# Patient Record
Sex: Female | Born: 1980 | Race: White | Hispanic: No | Marital: Married | Smoking: Never smoker
Health system: Southern US, Community
[De-identification: ages and names within clinical notes are randomized; demographics above are authoritative.]

## PROBLEM LIST (undated history)

## (undated) DIAGNOSIS — K8020 Calculus of gallbladder without cholecystitis without obstruction: Secondary | ICD-10-CM

## (undated) DIAGNOSIS — I10 Essential (primary) hypertension: Secondary | ICD-10-CM

## (undated) HISTORY — DX: Essential (primary) hypertension: I10

## (undated) HISTORY — PX: NO PAST SURGERIES: SHX2092

---

## 2018-09-15 LAB — OB RESULTS CONSOLE GC/CHLAMYDIA
Chlamydia: NEGATIVE
Gonorrhea: NEGATIVE

## 2018-09-15 LAB — OB RESULTS CONSOLE HEPATITIS B SURFACE ANTIGEN: Hepatitis B Surface Ag: NEGATIVE

## 2018-09-15 LAB — OB RESULTS CONSOLE RPR: RPR: NONREACTIVE

## 2018-09-15 LAB — OB RESULTS CONSOLE HIV ANTIBODY (ROUTINE TESTING): HIV: NONREACTIVE

## 2018-09-15 LAB — OB RESULTS CONSOLE ABO/RH: RH Type: NEGATIVE

## 2018-09-15 LAB — OB RESULTS CONSOLE RUBELLA ANTIBODY, IGM: Rubella: IMMUNE

## 2018-09-15 LAB — OB RESULTS CONSOLE ANTIBODY SCREEN: Antibody Screen: NEGATIVE

## 2018-11-03 ENCOUNTER — Other Ambulatory Visit: Payer: Self-pay

## 2018-11-03 DIAGNOSIS — Z20822 Contact with and (suspected) exposure to covid-19: Secondary | ICD-10-CM

## 2018-11-04 ENCOUNTER — Telehealth: Payer: Self-pay | Admitting: Obstetrics & Gynecology

## 2018-11-04 ENCOUNTER — Telehealth: Payer: Self-pay | Admitting: *Deleted

## 2018-11-04 LAB — NOVEL CORONAVIRUS, NAA: SARS-CoV-2, NAA: NOT DETECTED

## 2018-11-04 NOTE — Telephone Encounter (Signed)
Started having sinus pressure mainly on one side of her head.  Ear is now starting to hurt on right side along with eye pain.  Says her lymph nodes in her neck and face are swollen.  Says she has had sinus infections in the past and feels like she is getting one now.  Wants to know what medications she can take. Informed she can take Sudafed unless she has hypertension.  Pt states she is currently on BP medication.  Advised patient to not take medication but really sounded like she needed an antibiotic.  Informed since she was not an established patient in our office yet, we could not prescribe anything to her and could try doing an e-visit or going to Urgent Care to be evaluated.  Pt verbalized understanding.

## 2018-11-04 NOTE — Telephone Encounter (Signed)
Patient called, stated that she is 17 weeks, having some sinus trouble.  Stated she has been taking Tylenol and Flonase and still not not getting any relief.  Stated it has been going on for over a week now.  She would like to know what else she can take.  [place]

## 2018-11-13 ENCOUNTER — Other Ambulatory Visit: Payer: Self-pay | Admitting: Obstetrics and Gynecology

## 2018-11-13 DIAGNOSIS — Z363 Encounter for antenatal screening for malformations: Secondary | ICD-10-CM

## 2018-11-16 ENCOUNTER — Ambulatory Visit: Payer: BC Managed Care – PPO | Admitting: *Deleted

## 2018-11-16 ENCOUNTER — Encounter: Payer: Self-pay | Admitting: Women's Health

## 2018-11-16 ENCOUNTER — Ambulatory Visit (INDEPENDENT_AMBULATORY_CARE_PROVIDER_SITE_OTHER): Payer: BC Managed Care – PPO

## 2018-11-16 ENCOUNTER — Ambulatory Visit (INDEPENDENT_AMBULATORY_CARE_PROVIDER_SITE_OTHER): Payer: BC Managed Care – PPO | Admitting: Women's Health

## 2018-11-16 ENCOUNTER — Other Ambulatory Visit: Payer: Self-pay

## 2018-11-16 VITALS — BP 132/84 | HR 87 | Ht 70.0 in | Wt 254.0 lb

## 2018-11-16 DIAGNOSIS — O10919 Unspecified pre-existing hypertension complicating pregnancy, unspecified trimester: Secondary | ICD-10-CM

## 2018-11-16 DIAGNOSIS — O0990 Supervision of high risk pregnancy, unspecified, unspecified trimester: Secondary | ICD-10-CM

## 2018-11-16 DIAGNOSIS — Z363 Encounter for antenatal screening for malformations: Secondary | ICD-10-CM

## 2018-11-16 DIAGNOSIS — O0992 Supervision of high risk pregnancy, unspecified, second trimester: Secondary | ICD-10-CM

## 2018-11-16 DIAGNOSIS — O10912 Unspecified pre-existing hypertension complicating pregnancy, second trimester: Secondary | ICD-10-CM | POA: Diagnosis not present

## 2018-11-16 DIAGNOSIS — Z3A18 18 weeks gestation of pregnancy: Secondary | ICD-10-CM

## 2018-11-16 DIAGNOSIS — O4692 Antepartum hemorrhage, unspecified, second trimester: Secondary | ICD-10-CM

## 2018-11-16 DIAGNOSIS — Z1389 Encounter for screening for other disorder: Secondary | ICD-10-CM

## 2018-11-16 DIAGNOSIS — Z331 Pregnant state, incidental: Secondary | ICD-10-CM

## 2018-11-16 DIAGNOSIS — O4690 Antepartum hemorrhage, unspecified, unspecified trimester: Secondary | ICD-10-CM

## 2018-11-16 DIAGNOSIS — Z6791 Unspecified blood type, Rh negative: Secondary | ICD-10-CM

## 2018-11-16 DIAGNOSIS — O26899 Other specified pregnancy related conditions, unspecified trimester: Secondary | ICD-10-CM | POA: Insufficient documentation

## 2018-11-16 LAB — POCT URINALYSIS DIPSTICK OB
Blood, UA: NEGATIVE
Glucose, UA: NEGATIVE
Ketones, UA: NEGATIVE
Leukocytes, UA: NEGATIVE
Nitrite, UA: NEGATIVE
POC,PROTEIN,UA: NEGATIVE

## 2018-11-16 NOTE — Progress Notes (Signed)
INITIAL OBSTETRICAL VISIT Patient name: Tammy Mcmahon MRN [id number]  Date of birth: 10/12/1980 Chief Complaint:   Initial Prenatal Visit  History of Present Illness:   Tammy Mcmahon is a 38 y.o. [phone] Caucasian female at [redacted]w[redacted]d by LMP c/w 7wk u/s, with an Estimated Date of Delivery: 04/13/19 being seen today for her initial obstetrical visit with [place], she is transferring from [place].  Random occ spotting throughout pregnancy. Hasn't had rhogam.  Her obstetrical history is significant for term uncomplicated SVB x 2- CHTN 2nd pregnancy.  CHTN on Labetalol 100mg  BID, ASA daily Today she reports no complaints.  Patient's last menstrual period was 07/07/2018. Last pap 09/15/18. Results were: neg Review of Systems:   Pertinent items are noted in HPI Denies cramping/contractions, leakage of fluid, vaginal bleeding, abnormal vaginal discharge w/ itching/odor/irritation, headaches, visual changes, shortness of breath, chest pain, abdominal pain, severe nausea/vomiting, or problems with urination or bowel movements unless otherwise stated above.  Pertinent History Reviewed:  Reviewed past medical,surgical, social, obstetrical and family history.  Reviewed problem list, medications and allergies. OB History  Gravida Para Term Preterm AB Living  4 2 2   1 2   SAB TAB Ectopic Multiple Live Births  1       2    # Outcome Date GA Lbr Len/2nd Weight Sex Delivery Anes PTL Lv  4 Current           3 SAB 05/2017 [redacted]w[redacted]d         2 Term 09/06/11 [redacted]w[redacted]d  5 lb 14 oz (2.665 kg) M Vag-Spont None N LIV  1 Term 11/12/06 [redacted]w[redacted]d  5 lb 13 oz (2.637 kg) M Vag-Vacuum EPI N LIV   Physical Assessment:   Vitals:   11/16/18 1503 11/16/18 1503  BP: 132/84   Pulse: 87   Weight: 254 lb (115.2 kg)   Height:  5\' 10"  (1.778 m)  Body mass index is 36.45 kg/m.       Physical Examination:  General appearance - well appearing, and in no distress  Mental status - alert, oriented to person, place, and time  Psych:  She has a  normal mood and affect  Skin - warm and dry, normal color, no suspicious lesions noted  Chest - effort normal, all lung fields clear to auscultation bilaterally  Heart - normal rate and regular rhythm  Abdomen - soft, nontender  Extremities:  No swelling or varicosities noted  Thin prep pap is not done   TODAY'S anatomy u/s: [place] 44+3 wks,cephalic,posterior placenta gr 0,cx 4.4 cm,svp of fluid 4.4 cm,normal ovaries bilat,fhr 150 bpm,EFW 256 g 38%,anatomy complete,no obvious abnormalities   Results for orders placed or performed in visit on 11/16/18 (from the past 24 hour(s))  POC Urinalysis Dipstick OB   Collection Time: 11/16/18  3:14 PM  Result Value Ref Range   Color, UA     Clarity, UA     Glucose, UA Negative Negative   Bilirubin, UA     Ketones, UA neg    Spec Grav, UA     Blood, UA neg    pH, UA     POC,PROTEIN,UA Negative Negative, Trace, Small (1+), Moderate (2+), Large (3+), 4+   Urobilinogen, UA     Nitrite, UA neg    Leukocytes, UA Negative Negative   Appearance     Odor      Assessment & Plan:  1) High-Risk Pregnancy [id number] at [redacted]w[redacted]d with an Estimated Date of Delivery: 04/13/19   2) Initial OB  visit  3) CHTN> on Labetalol 100mg  BID, ASA. Growth u/s @ 24, 28, 32, 36wks    2x/wk testing nst/sono @ 32wks or weekly BPP    Deliver 39wks (37wks if poor control)  4) Random occasional spotting> none in 1-2wks, rh neg, rhogam today  Meds: No orders of the defined types were placed in this encounter.   Initial labs obtained Continue prenatal vitamins Reviewed n/v relief measures and warning s/s to report Reviewed recommended weight gain based on pre-gravid BMI Encouraged well-balanced diet Genetic Screening discussed: declined Cystic fibrosis, SMA, Fragile X screening discussed declined Ultrasound discussed; fetal survey: results reviewed [facility]NC completed>PCM not here, form faxed Check bp weekly, let us know if >140/90.   Follow-up: Return in about 4 weeks (around  12/14/2018) for HROB, US:EFW, in person.   Orders Placed This Encounter  Procedures  . US OB Follow Up  . RHO (D) Immune Globulin  . OB RESULTS CONSOLE GC/Chlamydia  . OB RESULTS CONSOLE RPR  . OB RESULTS CONSOLE HIV antibody  . OB RESULTS CONSOLE Rubella Antibody  . OB RESULTS CONSOLE Hepatitis B surface antigen  . POC Urinalysis Dipstick OB  . OB RESULTS CONSOLE ABO/Rh  . OB RESULTS CONSOLE Antibody Screen    Cheral MarkerKimberly R Manha Amato CNM, [facility]NP[facility]BC 11/16/2018 4:44 PM

## 2018-11-16 NOTE — Progress Notes (Signed)
[place] 50+5 wks,cephalic,posterior placenta gr 0,svp of fluid 4.4 cm,normal ovaries bilat,fhr 150 bpm,EFW 256 g 38%,anatomy complete,no obvious abnormalities

## 2018-11-16 NOTE — Patient Instructions (Addendum)
Tammy Mcmahon, I greatly value your feedback.  If you receive a survey following your visit with us today, we appreciate you taking the time to fill it out.  Thanks, Joellyn HaffKim Armeda Plumb, CNM, [facility]NP[facility]  [facility] is now [facility] ([address] [place], [place] [id number]) Entrance located off of [address] Free 24/7 valet parking   Nausea & Vomiting  Have saltine crackers or pretzels by your bed and eat a few bites before you raise your head out of bed in the morning  Eat small frequent meals throughout the day instead of large meals  Drink plenty of fluids throughout the day to stay hydrated, just don't drink a lot of fluids with your meals.  This can make your stomach fill up faster making you feel sick  Do not brush your teeth right after you eat  Products with real ginger are good for nausea, like ginger ale and ginger hard candy Make sure it says made with real ginger!  Sucking on sour candy like lemon heads is also good for nausea  If your prenatal vitamins make you nauseated, take them at night so you will sleep through the nausea  Sea Bands  If you feel like you need medicine for the nausea & vomiting please let us know  If you are unable to keep any fluids or food down please let us know   Constipation  Drink plenty of fluid, preferably water, throughout the day  Eat foods high in fiber such as fruits, vegetables, and grains  Exercise, such as walking, is a good way to keep your bowels regular  Drink warm fluids, especially warm prune juice, or decaf coffee  Eat a 1/2 cup of real oatmeal (not instant), 1/2 cup applesauce, and 1/2-1 cup warm prune juice every day  If needed, you may take Colace (docusate sodium) stool softener once or twice a day to help keep the stool soft.   If you still are having problems with constipation, you may take Miralax once daily as needed to help keep your bowels regular.   Home Blood  Pressure Monitoring for Patients   Your provider has recommended that you check your blood pressure (BP) at least once a week at home. If you do not have a blood pressure cuff at home, one will be provided for you. Contact your provider if you have not received your monitor within 1 week.   Helpful Tips for Accurate Home Blood Pressure Checks  . Don't smoke, exercise, or drink caffeine 30 minutes before checking your BP . Use the restroom before checking your BP (a full bladder can raise your pressure) . Relax in a comfortable upright chair . Feet on the ground . Left arm resting comfortably on a flat surface at the level of your heart . Legs uncrossed . Back supported . Sit quietly and don't talk . Place the cuff on your bare arm . Adjust snuggly, so that only two fingertips can fit between your skin and the top of the cuff . Check 2 readings separated by at least one minute . Keep a log of your BP readings . For a visual, please reference this diagram: http://ccnc.care/bpdiagram  Provider Name: [facility]     Phone: [phone]  Zone 1: ALL CLEAR  Continue to monitor your symptoms:  . BP reading is less than 140 (top number) or less than 90 (bottom number)  . No right upper stomach pain .  No headaches or seeing spots . No feeling nauseated or throwing up . No swelling in face and hands  Zone 2: CAUTION Call your doctor's office for any of the following:  . BP reading is greater than 140 (top number) or greater than 90 (bottom number)  . Stomach pain under your ribs in the middle or right side . Headaches or seeing spots . Feeling nauseated or throwing up . Swelling in face and hands  Zone 3: EMERGENCY  Seek immediate medical care if you have any of the following:  . BP reading is greater than160 (top number) or greater than 110 (bottom number) . Severe headaches not improving with Tylenol . Serious difficulty catching your breath . Any worsening symptoms from Zone  2    Second Trimester of Pregnancy The second trimester is from week 14 through week 27 (months 4 through 6). The second trimester is often a time when you feel your best. Your body has adjusted to being pregnant, and you begin to feel better physically. Usually, morning sickness has lessened or quit completely, you may have more energy, and you may have an increase in appetite. The second trimester is also a time when the fetus is growing rapidly. At the end of the sixth month, the fetus is about 9 inches long and weighs about 1 pounds. You will likely begin to feel the baby move (quickening) between 16 and 20 weeks of pregnancy. Body changes during your second trimester Your body continues to go through many changes during your second trimester. The changes vary from woman to woman.  Your weight will continue to increase. You will notice your lower abdomen bulging out.  You may begin to get stretch marks on your hips, abdomen, and breasts.  You may develop headaches that can be relieved by medicines. The medicines should be approved by your health care provider.  You may urinate more often because the fetus is pressing on your bladder.  You may develop or continue to have heartburn as a result of your pregnancy.  You may develop constipation because certain hormones are causing the muscles that push waste through your intestines to slow down.  You may develop hemorrhoids or swollen, bulging veins (varicose veins).  You may have back pain. This is caused by: ? Weight gain. ? Pregnancy hormones that are relaxing the joints in your pelvis. ? A shift in weight and the muscles that support your balance.  Your breasts will continue to grow and they will continue to become tender.  Your gums may bleed and may be sensitive to brushing and flossing.  Dark spots or blotches (chloasma, mask of pregnancy) may develop on your face. This will likely fade after the baby is born.  A dark line  from your belly button to the pubic area (linea nigra) may appear. This will likely fade after the baby is born.  You may have changes in your hair. These can include thickening of your hair, rapid growth, and changes in texture. Some women also have hair loss during or after pregnancy, or hair that feels dry or thin. Your hair will most likely return to normal after your baby is born. What to expect at prenatal visits During a routine prenatal visit:  You will be weighed to make sure you and the fetus are growing normally.  Your blood pressure will be taken.  Your abdomen will be measured to track your baby's growth.  The fetal heartbeat will be listened to.  Any  test results from the previous visit will be discussed. Your health care provider may ask you:  How you are feeling.  If you are feeling the baby move.  If you have had any abnormal symptoms, such as leaking fluid, bleeding, severe headaches, or abdominal cramping.  If you are using any tobacco products, including cigarettes, chewing tobacco, and electronic cigarettes.  If you have any questions. Other tests that may be performed during your second trimester include:  Blood tests that check for: ? Low iron levels (anemia). ? High blood sugar that affects pregnant women (gestational diabetes) between 47 and 28 weeks. ? Rh antibodies. This is to check for a protein on red blood cells (Rh factor).  Urine tests to check for infections, diabetes, or protein in the urine.  An ultrasound to confirm the proper growth and development of the baby.  An amniocentesis to check for possible genetic problems.  Fetal screens for spina bifida and Down syndrome.  HIV (human immunodeficiency virus) testing. Routine prenatal testing includes screening for HIV, unless you choose not to have this test. Follow these instructions at home: Medicines  Follow your health care provider's instructions regarding medicine use. Specific  medicines may be either safe or unsafe to take during pregnancy.  Take a prenatal vitamin that contains at least 600 micrograms (mcg) of folic acid.  If you develop constipation, try taking a stool softener if your health care provider approves. Eating and drinking   Eat a balanced diet that includes fresh fruits and vegetables, whole grains, good sources of protein such as meat, eggs, or tofu, and low-fat dairy. Your health care provider will help you determine the amount of weight gain that is right for you.  Avoid raw meat and uncooked cheese. These carry germs that can cause birth defects in the baby.  If you have low calcium intake from food, talk to your health care provider about whether you should take a daily calcium supplement.  Limit foods that are high in fat and processed sugars, such as fried and sweet foods.  To prevent constipation: ? Drink enough fluid to keep your urine clear or pale yellow. ? Eat foods that are high in fiber, such as fresh fruits and vegetables, whole grains, and beans. Activity  Exercise only as directed by your health care provider. Most women can continue their usual exercise routine during pregnancy. Try to exercise for 30 minutes at least 5 days a week. Stop exercising if you experience uterine contractions.  Avoid heavy lifting, wear low heel shoes, and practice good posture.  A sexual relationship may be continued unless your health care provider directs you otherwise. Relieving pain and discomfort  Wear a good support bra to prevent discomfort from breast tenderness.  Take warm sitz baths to soothe any pain or discomfort caused by hemorrhoids. Use hemorrhoid cream if your health care provider approves.  Rest with your legs elevated if you have leg cramps or low back pain.  If you develop varicose veins, wear support hose. Elevate your feet for 15 minutes, 3-4 times a day. Limit salt in your diet. Prenatal Care  Write down your  questions. Take them to your prenatal visits.  Keep all your prenatal visits as told by your health care provider. This is important. Safety  Wear your seat belt at all times when driving.  Make a list of emergency phone numbers, including numbers for family, friends, the hospital, and police and fire departments. General instructions  Ask your health care  provider for a referral to a local prenatal education class. Begin classes no later than the beginning of month 6 of your pregnancy.  Ask for help if you have counseling or nutritional needs during pregnancy. Your health care provider can offer advice or refer you to specialists for help with various needs.  Do not use hot tubs, steam rooms, or saunas.  Do not douche or use tampons or scented sanitary pads.  Do not cross your legs for long periods of time.  Avoid cat litter boxes and soil used by cats. These carry germs that can cause birth defects in the baby and possibly loss of the fetus by miscarriage or stillbirth.  Avoid all smoking, herbs, alcohol, and unprescribed drugs. Chemicals in these products can affect the formation and growth of the baby.  Do not use any products that contain nicotine or tobacco, such as cigarettes and e-cigarettes. If you need help quitting, ask your health care provider.  Visit your dentist if you have not gone yet during your pregnancy. Use a soft toothbrush to brush your teeth and be gentle when you floss. Contact a health care provider if:  You have dizziness.  You have mild pelvic cramps, pelvic pressure, or nagging pain in the abdominal area.  You have persistent nausea, vomiting, or diarrhea.  You have a bad smelling vaginal discharge.  You have pain when you urinate. Get help right away if:  You have a fever.  You are leaking fluid from your vagina.  You have spotting or bleeding from your vagina.  You have severe abdominal cramping or pain.  You have rapid weight gain or  weight loss.  You have shortness of breath with chest pain.  You notice sudden or extreme swelling of your face, hands, ankles, feet, or legs.  You have not felt your baby move in over an hour.  You have severe headaches that do not go away when you take medicine.  You have vision changes. Summary  The second trimester is from week 14 through week 27 (months 4 through 6). It is also a time when the fetus is growing rapidly.  Your body goes through many changes during pregnancy. The changes vary from woman to woman.  Avoid all smoking, herbs, alcohol, and unprescribed drugs. These chemicals affect the formation and growth your baby.  Do not use any tobacco products, such as cigarettes, chewing tobacco, and e-cigarettes. If you need help quitting, ask your health care provider.  Contact your health care provider if you have any questions. Keep all prenatal visits as told by your health care provider. This is important. This information is not intended to replace advice given to you by your health care provider. Make sure you discuss any questions you have with your health care provider. Document Released: 03/05/2001 Document Revised: 07/03/2018 Document Reviewed: 04/16/2016 Elsevier Patient Education  2020 [employer] Inc.  Coronavirus (COVID-19) Are you at risk?  Are you at risk for the Coronavirus (COVID-19)?  To be considered HIGH RISK for Coronavirus (COVID-19), you have to meet the following criteria:  . Traveled to [place], [place], [place], [place] or [place]; or in the [place] to [place], [place], or [place]; and have fever, cough, and shortness of breath within the last 2 weeks of travel OR . Been in close contact with a person diagnosed with COVID-19 within the last 2 weeks and have fever, cough, and shortness of breath . IF YOU DO NOT MEET THESE CRITERIA, YOU  ARE CONSIDERED LOW RISK FOR COVID-19.  What to do if you are HIGH RISK for COVID-19?  Marland Kitchen If  you are having a medical emergency, call 911. . Seek medical care right away. Before you go to a doctor's office, urgent care or emergency department, call ahead and tell them about your recent travel, contact with someone diagnosed with COVID-19, and your symptoms. You should receive instructions from your physician's office regarding next steps of care.  . When you arrive at healthcare provider, tell the healthcare staff immediately you have returned from visiting [place], [place], [place], [place] or [place]; or traveled in the [place] to [place], [place], [place], or [place]; in the last two weeks or you have been in close contact with a person diagnosed with COVID-19 in the last 2 weeks.   . Tell the health care staff about your symptoms: fever, cough and shortness of breath. . After you have been seen by a medical provider, you will be either: o Tested for (COVID-19) and discharged home on quarantine except to seek medical care if symptoms worsen, and asked to  - Stay home and avoid contact with others until you get your results (4-5 days)  - Avoid travel on public transportation if possible (such as bus, train, or airplane) or o Sent to the Emergency Department by EMS for evaluation, COVID-19 testing, and possible admission depending on your condition and test results.  What to do if you are LOW RISK for COVID-19?  Reduce your risk of any infection by using the same precautions used for avoiding the common cold or flu:  Marland Kitchen Wash your hands often with soap and warm water for at least 20 seconds.  If soap and water are not readily available, use an alcohol-based hand sanitizer with at least 60% alcohol.  . If coughing or sneezing, cover your mouth and nose by coughing or sneezing into the elbow areas of your shirt or coat, into a tissue or into your sleeve (not your hands). . Avoid shaking hands with others and consider head nods or verbal greetings only. . Avoid touching your eyes,  nose, or mouth with unwashed hands.  . Avoid close contact with people who are sick. . Avoid places or events with large numbers of people in one location, like concerts or sporting events. . Carefully consider travel plans you have or are making. . If you are planning any travel outside or inside the [place], visit the CDC's Travelers' Health webpage for the latest health notices. . If you have some symptoms but not all symptoms, continue to monitor at home and seek medical attention if your symptoms worsen. . If you are having a medical emergency, call 911.   [place] / e-Visit: [email or web address]         [facility]: [facility]: [phone]                   [facility]: [phone]

## 2018-12-12 ENCOUNTER — Encounter (HOSPITAL_COMMUNITY): Payer: Self-pay

## 2018-12-12 ENCOUNTER — Inpatient Hospital Stay (HOSPITAL_COMMUNITY)
Admission: AD | Admit: 2018-12-12 | Discharge: 2018-12-13 | Disposition: A | Discharge status: Home / Self Care | Payer: BC Managed Care – PPO | Attending: Obstetrics and Gynecology | Admitting: Obstetrics and Gynecology

## 2018-12-12 ENCOUNTER — Other Ambulatory Visit: Payer: Self-pay

## 2018-12-12 ENCOUNTER — Inpatient Hospital Stay (HOSPITAL_COMMUNITY): Payer: BC Managed Care – PPO

## 2018-12-12 DIAGNOSIS — Z3A22 22 weeks gestation of pregnancy: Secondary | ICD-10-CM | POA: Insufficient documentation

## 2018-12-12 DIAGNOSIS — R1010 Upper abdominal pain, unspecified: Secondary | ICD-10-CM | POA: Diagnosis not present

## 2018-12-12 DIAGNOSIS — O26612 Liver and biliary tract disorders in pregnancy, second trimester: Secondary | ICD-10-CM | POA: Insufficient documentation

## 2018-12-12 DIAGNOSIS — O162 Unspecified maternal hypertension, second trimester: Secondary | ICD-10-CM | POA: Insufficient documentation

## 2018-12-12 DIAGNOSIS — O26892 Other specified pregnancy related conditions, second trimester: Secondary | ICD-10-CM | POA: Diagnosis not present

## 2018-12-12 DIAGNOSIS — K8000 Calculus of gallbladder with acute cholecystitis without obstruction: Secondary | ICD-10-CM | POA: Insufficient documentation

## 2018-12-12 DIAGNOSIS — Z79899 Other long term (current) drug therapy: Secondary | ICD-10-CM | POA: Diagnosis not present

## 2018-12-12 LAB — COMPREHENSIVE METABOLIC PANEL
ALT: 14 U/L (ref 0–44)
AST: 59 U/L — ABNORMAL HIGH (ref 15–41)
Albumin: 3 g/dL — ABNORMAL LOW (ref 3.5–5.0)
Alkaline Phosphatase: 137 U/L — ABNORMAL HIGH (ref 38–126)
Anion gap: 9 (ref 5–15)
BUN: <5 mg/dL — ABNORMAL LOW (ref 6–20)
CO2: 22 mmol/L (ref 22–32)
Calcium: 8.6 mg/dL — ABNORMAL LOW (ref 8.9–10.3)
Chloride: 104 mmol/L (ref 98–111)
Creatinine, Ser: 0.52 mg/dL (ref 0.44–1.00)
GFR calc Af Amer: >60 mL/min (ref >60)
GFR calc non Af Amer: >60 mL/min (ref >60)
Glucose, Bld: 98 mg/dL (ref 70–99)
Potassium: 3.5 mmol/L (ref 3.5–5.1)
Sodium: 135 mmol/L (ref 135–145)
Total Bilirubin: 1.6 mg/dL — ABNORMAL HIGH (ref 0.3–1.2)
Total Protein: 6.3 g/dL — ABNORMAL LOW (ref 6.5–8.1)

## 2018-12-12 LAB — URINALYSIS, ROUTINE W REFLEX MICROSCOPIC
Bilirubin Urine: NEGATIVE
Glucose, UA: NEGATIVE mg/dL
Hgb urine dipstick: NEGATIVE
Ketones, ur: 5 mg/dL — AB
Leukocytes,Ua: NEGATIVE
Nitrite: NEGATIVE
Protein, ur: NEGATIVE mg/dL
Specific Gravity, Urine: 1.008 (ref 1.005–1.030)
pH: 7 (ref 5.0–8.0)

## 2018-12-12 LAB — PROTEIN / CREATININE RATIO, URINE
Creatinine, Urine: 51.31 mg/dL
Total Protein, Urine: <6 mg/dL

## 2018-12-12 LAB — CBC
HCT: 32.4 % — ABNORMAL LOW (ref 36.0–46.0)
Hemoglobin: 10.8 g/dL — ABNORMAL LOW (ref 12.0–15.0)
MCH: 29 pg (ref 26.0–34.0)
MCHC: 33.3 g/dL (ref 30.0–36.0)
MCV: 87.1 fL (ref 80.0–100.0)
Platelets: 186 10*3/uL (ref 150–400)
RBC: 3.72 MIL/uL — ABNORMAL LOW (ref 3.87–5.11)
RDW: 12.7 % (ref 11.5–15.5)
WBC: 9.3 10*3/uL (ref 4.0–10.5)
nRBC: 0 % (ref 0.0–0.2)

## 2018-12-12 LAB — LIPASE, BLOOD: Lipase: 30 U/L (ref 11–51)

## 2018-12-12 MED ORDER — ALUM & MAG HYDROXIDE-SIMETH 200-200-20 MG/5ML PO SUSP
30.0000 mL | Freq: Once | ORAL | Status: AC
  Administered 2018-12-12: 30 mL via ORAL
  Filled 2018-12-12: qty 30

## 2018-12-12 MED ORDER — LIDOCAINE VISCOUS HCL 2 % MT SOLN
15.0000 mL | Freq: Once | OROMUCOSAL | Status: AC
  Administered 2018-12-12: 23:00:00 15 mL via ORAL
  Filled 2018-12-12: qty 15

## 2018-12-12 MED ORDER — LABETALOL HCL 100 MG PO TABS
100.0000 mg | ORAL_TABLET | Freq: Once | ORAL | Status: AC
  Administered 2018-12-12: 100 mg via ORAL
  Filled 2018-12-12: qty 1

## 2018-12-12 MED ORDER — SOD CITRATE-CITRIC ACID 500-334 MG/5ML PO SOLN
30.0000 mL | Freq: Once | ORAL | Status: AC
  Administered 2018-12-12: 30 mL via ORAL
  Filled 2018-12-12: qty 30

## 2018-12-12 NOTE — MAU Provider Note (Signed)
History     CSN: [id number]  Arrival date and time: 12/12/18 2147   First Provider Initiated Contact with Patient 12/12/18 2224      Chief Complaint  Patient presents with  . Abdominal Pain   G4P2012 @22 .4 wks presenting with upper abd pain. Pain started this am. Located bilateral, but worse on right, and epigastric. Radiates to right shoulder. Feels like gas pain. Has tried Gas-X. Admits to eating gravy biscuit this am which she rarely eats. Rates 6/10. Reports +FM. No pregnancy c/o.    OB History    Gravida  4   Para  2   Term  2   Preterm      AB  1   Living  2     SAB  1   TAB      Ectopic      Multiple      Live Births  2           Past Medical History:  Diagnosis Date  . Hypertension     Past Surgical History:  Procedure Laterality Date  . NO PAST SURGERIES      Family History  Problem Relation Age of Onset  . Diabetes Father     Social History   Tobacco Use  . Smoking status: Never Smoker  . Smokeless tobacco: Never Used  Substance Use Topics  . Alcohol use: Never    Frequency: Never  . Drug use: Never    Allergies:  Allergies  Allergen Reactions  . Fish Oil     Medications Prior to Admission  Medication Sig Dispense Refill Last Dose  . GNP ASPIRIN LOW DOSE 81 MG EC tablet    12/12/2018 at Unknown time  . labetalol (NORMODYNE) 100 MG tablet 100 mg 2 (two) times daily.    12/12/2018 at Unknown time  . ondansetron (ZOFRAN) 8 MG tablet Take by mouth every 8 (eight) hours as needed for nausea or vomiting.   12/11/2018 at Unknown time  . ipratropium (ATROVENT) 0.06 % nasal spray      . montelukast (SINGULAIR) 10 MG tablet        Review of Systems  Constitutional: Negative for fever.  Respiratory: Negative for shortness of breath.   Cardiovascular: Negative for chest pain.  Gastrointestinal: Positive for abdominal pain and nausea. Negative for constipation, diarrhea and vomiting.  Genitourinary: Negative for vaginal bleeding.    Physical Exam   Blood pressure 110/65, pulse 88, temperature 97.8 F (36.6 C), temperature source Oral, resp. rate 18, height 5\' 10"  (1.778 m), weight 117.2 kg, last menstrual period 07/07/2018, SpO2 99 %. Patient Vitals for the past 24 hrs:  BP Temp Temp src Pulse Resp SpO2 Height Weight  12/13/18 0116 110/65 - - 88 - - - -  12/13/18 0046 (!) 145/81 - - 93 - - - -  12/13/18 0037 (!) 157/100 - - 98 - - - -  12/12/18 2331 118/65 - - 96 - - - -  12/12/18 2316 113/67 - - 94 - - - -  12/12/18 2301 124/67 - - 98 - - - -  12/12/18 2218 (!) 147/89 - - (!) 118 - - - -  12/12/18 2207 (!) 154/86 97.8 F (36.6 C) Oral (!) 116 18 99 % - -  12/12/18 2159 - - - - - - 5\' 10"  (1.778 m) 117.2 kg    Physical Exam  Nursing note and vitals reviewed. Constitutional: She is oriented to person, place, and time. She appears  well-developed and well-nourished. No distress.  HENT:  Head: Normocephalic and atraumatic.  Neck: Normal range of motion.  Respiratory: Effort normal.  GI: Soft. She exhibits no distension and no mass. There is abdominal tenderness in the right upper quadrant and epigastric area. There is no rebound and no guarding.  gravid  Musculoskeletal: Normal range of motion.  Neurological: She is alert and oriented to person, place, and time.  Skin: Skin is warm and dry.  Psychiatric: She has a normal mood and affect.  FHT 158  Results for orders placed or performed during the hospital encounter of 12/12/18 (from the past 24 hour(s))  Urinalysis, Routine w reflex microscopic     Status: Abnormal   Collection Time: 12/12/18 10:16 PM  Result Value Ref Range   Color, Urine YELLOW YELLOW   APPearance CLEAR CLEAR   Specific Gravity, Urine 1.008 1.005 - 1.030   pH 7.0 5.0 - 8.0   Glucose, UA NEGATIVE NEGATIVE mg/dL   Hgb urine dipstick NEGATIVE NEGATIVE   Bilirubin Urine NEGATIVE NEGATIVE   Ketones, ur 5 (A) NEGATIVE mg/dL   Protein, ur NEGATIVE NEGATIVE mg/dL   Nitrite NEGATIVE  NEGATIVE   Leukocytes,Ua NEGATIVE NEGATIVE  Protein / creatinine ratio, urine     Status: None   Collection Time: 12/12/18 10:16 PM  Result Value Ref Range   Creatinine, Urine 51.31 mg/dL   Total Protein, Urine <6 mg/dL   Protein Creatinine Ratio        0.00 - 0.15 mg/mg[Cre]  CBC     Status: Abnormal   Collection Time: 12/12/18 10:34 PM  Result Value Ref Range   WBC 9.3 4.0 - 10.5 K/uL   RBC 3.72 (L) 3.87 - 5.11 MIL/uL   Hemoglobin 10.8 (L) 12.0 - 15.0 g/dL   HCT 16.132.4 (L) 09.636.0 - 04.546.0 %   MCV 87.1 80.0 - 100.0 fL   MCH 29.0 26.0 - 34.0 pg   MCHC 33.3 30.0 - 36.0 g/dL   RDW 40.912.7 81.111.5 - 91.415.5 %   Platelets 186 150 - 400 K/uL   nRBC 0.0 0.0 - 0.2 %  Comprehensive metabolic panel     Status: Abnormal   Collection Time: 12/12/18 10:34 PM  Result Value Ref Range   Sodium 135 135 - 145 mmol/L   Potassium 3.5 3.5 - 5.1 mmol/L   Chloride 104 98 - 111 mmol/L   CO2 22 22 - 32 mmol/L   Glucose, Bld 98 70 - 99 mg/dL   BUN <5 (L) 6 - 20 mg/dL   Creatinine, Ser 7.820.52 0.44 - 1.00 mg/dL   Calcium 8.6 (L) 8.9 - 10.3 mg/dL   Total Protein 6.3 (L) 6.5 - 8.1 g/dL   Albumin 3.0 (L) 3.5 - 5.0 g/dL   AST 59 (H) 15 - 41 U/L   ALT 14 0 - 44 U/L   Alkaline Phosphatase 137 (H) 38 - 126 U/L   Total Bilirubin 1.6 (H) 0.3 - 1.2 mg/dL   GFR calc non Af Amer >60 >60 mL/min   GFR calc Af Amer >60 >60 mL/min   Anion gap 9 5 - 15  Lipase, blood     Status: None   Collection Time: 12/12/18 10:34 PM  Result Value Ref Range   Lipase 30 11 - 51 U/L   [place] Abdomen Limited Ruq  Result Date: 12/13/2018 CLINICAL DATA:  Right upper quadrant pain EXAM: ULTRASOUND ABDOMEN LIMITED RIGHT UPPER QUADRANT COMPARISON:  None. FINDINGS: Gallbladder: There is a mobile stone within the gallbladder  that measures up to 1.8 cm. A positive sonographic Percell Miller sign was reported by the sonographer. No gallbladder wall thickening or pericholecystic fluid. Common bile duct: Diameter: 12 mm Liver: No focal lesion identified. Within normal  limits in parenchymal echogenicity. Portal vein is patent on color Doppler imaging with normal direction of blood flow towards the liver. Other: None. IMPRESSION: Cholelithiasis with reported positive sonographic Percell Miller sign is compatible with acute cholecystitis in the appropriate context. Dilated common bile duct. Electronically Signed   By: Ulyses Jarred M.D.   On: 12/13/2018 00:24   MAU Course  Procedures GI cocktail Bicitra Morphine Phenergan  MDM Labs ordered and reviewed. Temporary relief after GI cocktail. Bicitra ordered. Elevated AST, RUQ [place] ordered. Cholelithiasis found on [place], no leukocytosis or fever. Consult with Dr. Rip Harbour, will treat pain/nausea and place ambulatory referral to general surgery. Pain improved after pain meds. Stable for discharge home.   Assessment and Plan   1. Calculus of gallbladder with acute cholecystitis without obstruction   2. Upper abdominal pain   3. [redacted] weeks gestation of pregnancy    Discharge home Follow up at [facility] in 2 days Referral to General Surgery placed [facility] until 9/22- letter provided Rx Phenergan Rx Percocet- advised against driving Return precautions  Allergies as of 12/13/2018      Reactions   Fish Oil       Medication List    TAKE these medications   GNP Aspirin Low Dose 81 MG EC tablet Generic drug: aspirin   ipratropium 0.06 % nasal spray Commonly known as: ATROVENT   labetalol 100 MG tablet Commonly known as: NORMODYNE 100 mg 2 (two) times daily.   montelukast 10 MG tablet Commonly known as: SINGULAIR   ondansetron 8 MG tablet Commonly known as: ZOFRAN Take by mouth every 8 (eight) hours as needed for nausea or vomiting.   oxyCODONE-acetaminophen 5-325 MG tablet Commonly known as: PERCOCET/ROXICET Take 1 tablet by mouth every 4 (four) hours as needed for severe pain.   promethazine 25 MG tablet Commonly known as: PHENERGAN Take 0.5-1 tablets (12.5-25 mg total) by mouth every 6 (six) hours as needed  for nausea or vomiting.      Julianne Handler, CNM 12/13/2018, 1:43 AM

## 2018-12-12 NOTE — MAU Note (Signed)
Pt here with c/o upper abdominal pain. Started this morning that it feels like gas pain. Took Mylanta and gas X. Helps some, but pain never completely goes away. Radiating across upper abdomen. Is passing gas. Tried Tylenol but did not help. Feels tender to touch and bloated. Denies vaginal bleeding or discharge. Feels like she has heartburn-burning in chest and throat. Does not taken anything for heartburn.

## 2018-12-13 DIAGNOSIS — O26612 Liver and biliary tract disorders in pregnancy, second trimester: Secondary | ICD-10-CM | POA: Diagnosis not present

## 2018-12-13 DIAGNOSIS — K8020 Calculus of gallbladder without cholecystitis without obstruction: Secondary | ICD-10-CM | POA: Insufficient documentation

## 2018-12-13 MED ORDER — MORPHINE SULFATE (PF) 4 MG/ML IV SOLN
4.0000 mg | Freq: Once | INTRAVENOUS | Status: AC
  Administered 2018-12-13: 4 mg via INTRAMUSCULAR
  Filled 2018-12-13: qty 1

## 2018-12-13 MED ORDER — PROMETHAZINE HCL 25 MG PO TABS: 12.5000 mg | ORAL_TABLET | Freq: Four times a day (QID) | ORAL | 0 refills | Status: DC | PRN

## 2018-12-13 MED ORDER — PROMETHAZINE HCL 25 MG/ML IJ SOLN
25.0000 mg | Freq: Four times a day (QID) | INTRAMUSCULAR | Status: DC | PRN
  Administered 2018-12-13: 25 mg via INTRAMUSCULAR
  Filled 2018-12-13: qty 1

## 2018-12-13 MED ORDER — OXYCODONE-ACETAMINOPHEN 5-325 MG PO TABS: 1.0000 | ORAL_TABLET | ORAL | 0 refills | Status: DC | PRN

## 2018-12-13 NOTE — Discharge Instructions (Signed)
Cholelithiasis  Cholelithiasis is also called "gallstones." It is a kind of gallbladder disease. The gallbladder is an organ that stores a liquid (bile) that helps you digest fat. Gallstones may not cause symptoms (may be silent gallstones) until they cause a blockage, and then they can cause pain (gallbladder attack). Follow these instructions at home:  Take over-the-counter and prescription medicines only as told by your doctor.  Stay at a healthy weight.  Eat healthy foods. This includes: ? Eating fewer fatty foods, like fried foods. ? Eating fewer refined carbs (refined carbohydrates). Refined carbs are breads and grains that are highly processed, like white bread and white rice. Instead, choose whole grains like whole-wheat bread and brown rice. ? Eating more fiber. Almonds, fresh fruit, and beans are healthy sources of fiber.  Keep all follow-up visits as told by your doctor. This is important. Contact a doctor if:  You have sudden pain in the upper right side of your belly (abdomen). Pain might spread to your right shoulder or your chest. This may be a sign of a gallbladder attack.  You feel sick to your stomach (are nauseous).  You throw up (vomit).  You have been diagnosed with gallstones that have no symptoms and you get: ? Belly pain. ? Discomfort, burning, or fullness in the upper part of your belly (indigestion). Get help right away if:  You have sudden pain in the upper right side of your belly, and it lasts for more than 2 hours.  You have belly pain that lasts for more than 5 hours.  You have a fever or chills.  You keep feeling sick to your stomach or you keep throwing up.  Your skin or the whites of your eyes turn yellow (jaundice).  You have dark-colored pee (urine).  You have light-colored poop (stool). Summary  Cholelithiasis is also called "gallstones."  The gallbladder is an organ that stores a liquid (bile) that helps you digest fat.  Silent  gallstones are gallstones that do not cause symptoms.  A gallbladder attack may cause sudden pain in the upper right side of your belly. Pain might spread to your right shoulder or your chest. If this happens, contact your doctor.  If you have sudden pain in the upper right side of your belly that lasts for more than 2 hours, get help right away. This information is not intended to replace advice given to you by your health care provider. Make sure you discuss any questions you have with your health care provider. Document Released: 08/28/2007 Document Revised: 02/21/2017 Document Reviewed: 11/26/2015 Elsevier Patient Education  2020 [employer]  Gallbladder Eating Plan If you have a gallbladder condition, you may have trouble digesting fats. Eating a low-fat diet can help reduce your symptoms, and may be helpful before and after having surgery to remove your gallbladder (cholecystectomy). Your health care provider may recommend that you work with a diet and nutrition specialist (dietitian) to help you reduce the amount of fat in your diet. What are tips for following this plan? General guidelines  Limit your fat intake to less than 30% of your total daily calories. If you eat around 1,800 calories each day, this is less than 60 grams (g) of fat per day.  Fat is an important part of a healthy diet. Eating a low-fat diet can make it hard to maintain a healthy body weight. Ask your dietitian how much fat, calories, and other nutrients you need each day.  Eat small, frequent meals throughout the  day instead of three large meals.  Drink at least 8-10 cups of fluid a day. Drink enough fluid to keep your urine clear or pale yellow.  Limit alcohol intake to no more than 1 drink a day for nonpregnant women and 2 drinks a day for men. One drink equals 12 oz of beer, 5 oz of wine, or 1 oz of hard liquor. Reading food labels  Check Nutrition Facts on food labels for the amount of fat per serving.  Choose foods with less than 3 grams of fat per serving. Shopping  Choose nonfat and low-fat healthy foods. Look for the words nonfat, low fat, or fat free.  Avoid buying processed or prepackaged foods. Cooking  Cook using low-fat methods, such as baking, broiling, grilling, or boiling.  Cook with small amounts of healthy fats, such as olive oil, grapeseed oil, canola oil, or sunflower oil. What foods are recommended?   All fresh, frozen, or canned fruits and vegetables.  Whole grains.  Low-fat or non-fat (skim) milk and yogurt.  Lean meat, skinless poultry, fish, eggs, and beans.  Low-fat protein supplement powders or drinks.  Spices and herbs. What foods are not recommended?  High-fat foods. These include baked goods, fast food, fatty cuts of meat, ice cream, french toast, sweet rolls, pizza, cheese bread, foods covered with butter, creamy sauces, or cheese.  Fried foods. These include french fries, tempura, battered fish, breaded chicken, fried breads, and sweets.  Foods with strong odors.  Foods that cause bloating and gas. Summary  A low-fat diet can be helpful if you have a gallbladder condition, or before and after gallbladder surgery.  Limit your fat intake to less than 30% of your total daily calories. This is about 60 g of fat if you eat 1,800 calories each day.  Eat small, frequent meals throughout the day instead of three large meals. This information is not intended to replace advice given to you by your health care provider. Make sure you discuss any questions you have with your health care provider. Document Released: 03/16/2013 Document Revised: 07/02/2018 Document Reviewed: 04/18/2016 Elsevier Patient Education  2020 [employer].

## 2018-12-14 ENCOUNTER — Other Ambulatory Visit: Payer: Self-pay

## 2018-12-14 ENCOUNTER — Ambulatory Visit (INDEPENDENT_AMBULATORY_CARE_PROVIDER_SITE_OTHER): Payer: BC Managed Care – PPO | Admitting: Advanced Practice Midwife

## 2018-12-14 ENCOUNTER — Ambulatory Visit (INDEPENDENT_AMBULATORY_CARE_PROVIDER_SITE_OTHER): Payer: BC Managed Care – PPO

## 2018-12-14 ENCOUNTER — Encounter: Payer: Self-pay | Admitting: Advanced Practice Midwife

## 2018-12-14 VITALS — BP 135/82 | HR 101 | Wt 256.0 lb

## 2018-12-14 DIAGNOSIS — O10912 Unspecified pre-existing hypertension complicating pregnancy, second trimester: Secondary | ICD-10-CM | POA: Diagnosis not present

## 2018-12-14 DIAGNOSIS — Z3A22 22 weeks gestation of pregnancy: Secondary | ICD-10-CM

## 2018-12-14 DIAGNOSIS — K8000 Calculus of gallbladder with acute cholecystitis without obstruction: Secondary | ICD-10-CM

## 2018-12-14 DIAGNOSIS — O0992 Supervision of high risk pregnancy, unspecified, second trimester: Secondary | ICD-10-CM

## 2018-12-14 DIAGNOSIS — Z331 Pregnant state, incidental: Secondary | ICD-10-CM

## 2018-12-14 DIAGNOSIS — Z1389 Encounter for screening for other disorder: Secondary | ICD-10-CM

## 2018-12-14 DIAGNOSIS — O0990 Supervision of high risk pregnancy, unspecified, unspecified trimester: Secondary | ICD-10-CM

## 2018-12-14 DIAGNOSIS — O10919 Unspecified pre-existing hypertension complicating pregnancy, unspecified trimester: Secondary | ICD-10-CM

## 2018-12-14 LAB — POCT URINALYSIS DIPSTICK OB
Blood, UA: NEGATIVE
Glucose, UA: NEGATIVE
Ketones, UA: NEGATIVE
Leukocytes, UA: NEGATIVE
Nitrite, UA: NEGATIVE
POC,PROTEIN,UA: NEGATIVE

## 2018-12-14 MED ORDER — OMEPRAZOLE 20 MG PO CPDR: 20.0000 mg | DELAYED_RELEASE_CAPSULE | Freq: Every day | ORAL | 6 refills | Status: DC

## 2018-12-14 NOTE — Progress Notes (Signed)
Korea 67+5 wks,cephalic,posterior placenta gr 0,normal ovaries bilat,svp of fluid 6 cm,fhr 157 bpm,cx 3.8 cm,EFW 553 g 49 %

## 2018-12-14 NOTE — Progress Notes (Signed)
HIGH-RISK PREGNANCY VISIT Patient name: Tammy Mcmahon MRN [id number]  Date of birth: [birth year] Chief Complaint:   High Risk Gestation ([place] today; went to [facility] Saturday with gallstones; + pain)  History of Present Illness:   Catherene Kaleta is a 38 y.o. [id number] female at [redacted]w[redacted]d with an Estimated Date of Delivery: 04/13/19 being seen today for ongoing management of a high-risk pregnancy complicated by [facility] currently on labetalol Dx w/gallstones yesterday in MAU, referred to General Surgery sent per Dr. Marjory Lies note.  Today she reports RUQ pain if sits up too long.  Hasn't taken any percocet, doesn't want to unless has to.  [place] is bad. Rx prilosec. Marland Kitchen Contractions: Not present. Vag. Bleeding: None.  Movement: Present. denies leaking of fluid.  Review of Systems:   Pertinent items are noted in HPI Denies abnormal vaginal discharge w/ itching/odor/irritation, headaches, visual changes, shortness of breath, chest pain, abdominal pain, severe nausea/vomiting, or problems with urination or bowel movements unless otherwise stated above. Pertinent History Reviewed:  Reviewed past medical,surgical, social, obstetrical and family history.  Reviewed problem list, medications and allergies. Physical Assessment:   Vitals:   12/14/18 1630 12/14/18 1634  BP: (!) 144/86 135/82  Pulse: (!) 101 (!) 101  Weight: 256 lb (116.1 kg)   Body mass index is 36.73 kg/m.           Physical Examination:   General appearance: alert, well appearing, and in no distress  Mental status: alert, oriented to person, place, and time  Skin: warm & dry   Extremities: Edema: Trace    Cardiovascular: normal heart rate noted  Respiratory: normal respiratory effort, no distress  Abdomen: gravid, soft,   Pelvic: Cervical exam deferred         Fetal Status:     Movement: Present    Fetal Surveillance Testing today: [place].  [place] 17+5 wks,cephalic,posterior placenta gr 0,normal ovaries bilat,svp of fluid 6 cm,fhr 157  bpm,cx 3.8 cm,EFW 553 g 49 %   Results for orders placed or performed in visit on 12/14/18 (from the past 24 hour(s))  POC Urinalysis Dipstick OB   Collection Time: 12/14/18  4:07 PM  Result Value Ref Range   Color, UA     Clarity, UA     Glucose, UA Negative Negative   Bilirubin, UA     Ketones, UA neg    Spec Grav, UA     Blood, UA neg    pH, UA     POC,PROTEIN,UA Negative Negative, Trace, Small (1+), Moderate (2+), Large (3+), 4+   Urobilinogen, UA     Nitrite, UA neg    Leukocytes, UA Negative Negative   Appearance     Odor      Assessment & Plan:  1) High-risk pregnancy [id number] at [redacted]w[redacted]d with an Estimated Date of Delivery: 04/13/19   2) CHTN, stable  3) Cholelithiasis, continue current meds.  If doesn't hear from [place] by Thursday, let me know  Meds:  Meds ordered this encounter  Medications  . omeprazole (PRILOSEC) 20 MG capsule    Sig: Take 1 capsule (20 mg total) by mouth daily.    Dispense:  30 capsule    Refill:  6    Order Specific Question:   Supervising Provider    Answer:   Tania Ade H [[id number]]    Labs/procedures today: none  Treatment Plan: growth Korea 28, 33, 36wks     2x/wk testing nst/sono @ 36wks or weekly BPP   Deliver @ 39wks:______  Reviewed: Preterm labor symptoms and general obstetric precautions including but not limited to vaginal bleeding, contractions, leaking of fluid and fetal movement were reviewed in detail with the patient.  All questions were answered. Has home bp cuff.  Check bp weekly, let us know ifconsistently >140/90.   Follow-up: Return in about 4 weeks (around 01/11/2019) for HROB, PN2.  Orders Placed This Encounter  Procedures  . POC Urinalysis Dipstick OB   Jacklyn Shell DNP, CNM 12/14/2018 5:06 PM

## 2018-12-14 NOTE — Patient Instructions (Signed)

## 2018-12-17 ENCOUNTER — Encounter: Payer: Self-pay | Admitting: Advanced Practice Midwife

## 2018-12-18 ENCOUNTER — Other Ambulatory Visit: Payer: Self-pay | Admitting: *Deleted

## 2018-12-18 ENCOUNTER — Telehealth: Payer: Self-pay | Admitting: *Deleted

## 2018-12-18 NOTE — Telephone Encounter (Signed)
Patient left two messages on nurse line. States that she needs zofran called in. Also said that Manus Gunning told her to let us know if she hasn't heard from general surgeon for gallbladder.

## 2018-12-21 ENCOUNTER — Other Ambulatory Visit: Payer: Self-pay | Admitting: *Deleted

## 2018-12-21 MED ORDER — ONDANSETRON HCL 8 MG PO TABS: 8.0000 mg | ORAL_TABLET | Freq: Three times a day (TID) | ORAL | 1 refills | Status: DC | PRN

## 2018-12-23 ENCOUNTER — Encounter: Payer: Self-pay | Admitting: *Deleted

## 2018-12-25 ENCOUNTER — Telehealth: Payer: Self-pay | Admitting: *Deleted

## 2018-12-25 ENCOUNTER — Encounter: Payer: Self-pay | Admitting: *Deleted

## 2018-12-25 NOTE — Telephone Encounter (Signed)
Patent states she is feeling better but is still having issues with her stomach at times.  She has made some changes to her diet and thinks this is helping along with taking the Prilosec.  Encouraged patient to continue low fat diet and information sent via mychart for reference.  Advised to let us know if symptoms worsen, and we can send referral to [facility].  Pt verbalized understanding and agreeable to plan.

## 2019-01-05 ENCOUNTER — Other Ambulatory Visit: Payer: Self-pay

## 2019-01-05 DIAGNOSIS — Z20822 Contact with and (suspected) exposure to covid-19: Secondary | ICD-10-CM

## 2019-01-07 LAB — NOVEL CORONAVIRUS, NAA: SARS-CoV-2, NAA: NOT DETECTED

## 2019-01-08 ENCOUNTER — Encounter: Payer: Self-pay | Admitting: Women's Health

## 2019-01-08 ENCOUNTER — Ambulatory Visit (INDEPENDENT_AMBULATORY_CARE_PROVIDER_SITE_OTHER): Payer: BC Managed Care – PPO | Admitting: Women's Health

## 2019-01-08 ENCOUNTER — Encounter: Payer: BC Managed Care – PPO | Admitting: Women's Health

## 2019-01-08 ENCOUNTER — Other Ambulatory Visit: Payer: BC Managed Care – PPO

## 2019-01-08 ENCOUNTER — Other Ambulatory Visit: Payer: Self-pay

## 2019-01-08 VITALS — BP 123/76 | HR 95 | Wt 261.8 lb

## 2019-01-08 DIAGNOSIS — O0990 Supervision of high risk pregnancy, unspecified, unspecified trimester: Secondary | ICD-10-CM

## 2019-01-08 DIAGNOSIS — Z3A26 26 weeks gestation of pregnancy: Secondary | ICD-10-CM

## 2019-01-08 DIAGNOSIS — Z331 Pregnant state, incidental: Secondary | ICD-10-CM

## 2019-01-08 DIAGNOSIS — Z1389 Encounter for screening for other disorder: Secondary | ICD-10-CM

## 2019-01-08 DIAGNOSIS — O10912 Unspecified pre-existing hypertension complicating pregnancy, second trimester: Secondary | ICD-10-CM

## 2019-01-08 DIAGNOSIS — Z131 Encounter for screening for diabetes mellitus: Secondary | ICD-10-CM

## 2019-01-08 DIAGNOSIS — O10919 Unspecified pre-existing hypertension complicating pregnancy, unspecified trimester: Secondary | ICD-10-CM

## 2019-01-08 DIAGNOSIS — O0992 Supervision of high risk pregnancy, unspecified, second trimester: Secondary | ICD-10-CM

## 2019-01-08 LAB — POCT URINALYSIS DIPSTICK OB
Blood, UA: NEGATIVE
Glucose, UA: NEGATIVE
Ketones, UA: NEGATIVE
Leukocytes, UA: NEGATIVE
Nitrite, UA: NEGATIVE
POC,PROTEIN,UA: NEGATIVE

## 2019-01-08 NOTE — Progress Notes (Signed)
   HIGH-RISK PREGNANCY VISIT Patient name: Tammy Mcmahon MRN [id number]  Date of birth: Aug 20, 1980 Chief Complaint:   High Risk Gestation (PN2)  History of Present Illness:   Jimi Giza is a 38 y.o. [phone] female at [redacted]w[redacted]d with an Estimated Date of Delivery: 04/13/19 being seen today for ongoing management of a high-risk pregnancy complicated by chronic hypertension currently on Labetalol 100mg  BID. Cholelithiasis- stable.  Today she reports no complaints. Contractions: Not present.  .  Movement: Present. denies leaking of fluid.  Review of Systems:   Pertinent items are noted in HPI Denies abnormal vaginal discharge w/ itching/odor/irritation, headaches, visual changes, shortness of breath, chest pain, abdominal pain, severe nausea/vomiting, or problems with urination or bowel movements unless otherwise stated above. Pertinent History Reviewed:  Reviewed past medical,surgical, social, obstetrical and family history.  Reviewed problem list, medications and allergies. Physical Assessment:   Vitals:   01/08/19 0918  BP: 123/76  Pulse: 95  Weight: 261 lb 12.8 oz (118.8 kg)  Body mass index is 37.56 kg/m.           Physical Examination:   General appearance: alert, well appearing, and in no distress  Mental status: alert, oriented to person, place, and time  Skin: warm & dry   Extremities: Edema: None    Cardiovascular: normal heart rate noted  Respiratory: normal respiratory effort, no distress  Abdomen: gravid, soft, non-tender  Pelvic: Cervical exam deferred         Fetal Status: Fetal Heart Rate (bpm): 154   Movement: Present    Fetal Surveillance Testing today: doppler   Results for orders placed or performed in visit on 01/08/19 (from the past 24 hour(s))  POC Urinalysis Dipstick OB   Collection Time: 01/08/19  9:21 AM  Result Value Ref Range   Color, UA     Clarity, UA     Glucose, UA Negative Negative   Bilirubin, UA     Ketones, UA neg    Spec Grav, UA     Blood,  UA neg    pH, UA     POC,PROTEIN,UA Negative Negative, Trace, Small (1+), Moderate (2+), Large (3+), 4+   Urobilinogen, UA     Nitrite, UA neg    Leukocytes, UA Negative Negative   Appearance     Odor      Assessment & Plan:  1) High-risk pregnancy [id number] at [redacted]w[redacted]d with an Estimated Date of Delivery: 04/13/19   2) CHTN, stable on Labetalol 100mg  BID, ASA  3) Cholelithiasis, stable, on low-fat diet  Meds: No orders of the defined types were placed in this encounter.  Labs/procedures today: pn2, declines flu shot today- will get at pharmacy  Treatment Plan:  Growth u/s- needs now then q4wks     2x/wk testing nst/sono @ 32wks or weekly BPP    Deliver 38-39wks (37wks or prn if poor control)  Reviewed: Preterm labor symptoms and general obstetric precautions including but not limited to vaginal bleeding, contractions, leaking of fluid and fetal movement were reviewed in detail with the patient.  All questions were answered.   Follow-up: Return for asap efw u/s, then 4wks for HROB , US:EFW, in person, MD.  Orders Placed This Encounter  Procedures  . US OB Follow Up  . POC Urinalysis Dipstick OB   Roma Schanz CNM, [facility] 01/08/2019 10:32 AM

## 2019-01-08 NOTE — Patient Instructions (Signed)
Tammy Mcmahon, I greatly value your feedback.  If you receive a survey following your visit with [place] today, we appreciate you taking the time to fill it out.  Thanks, Tammy Mcmahon, CNM, [facility]  [place]!!! It is now [place] at [address] [facility] ([place], [place] [address]) Entrance located off of [address] parking    Go to [email or web address] to register for FREE online childbirth classes   Call the office [phone]) or go to [facility] if:  You begin to have strong, frequent contractions  Your water breaks.  Sometimes it is a big gush of fluid, sometimes it is just a trickle that keeps getting your panties wet or running down your legs  You have vaginal bleeding.  It is normal to have a small amount of spotting if your cervix was checked.   You don't feel your baby moving like normal.  If you don't, get you something to eat and drink and lay down and focus on feeling your baby move.  You should feel at least 10 movements in 2 hours.  If you don't, you should call the office or go to [facility].    Tdap Vaccine  It is recommended that you get the Tdap vaccine during the third trimester of EACH pregnancy to help protect your baby from getting pertussis (whooping cough)  27-36 weeks is the BEST time to do this so that you can pass the protection on to your baby. During pregnancy is better than after pregnancy, but if you are unable to get it during pregnancy it will be offered at the hospital.   You can get this vaccine with [place], at the health department, your family doctor, or some local pharmacies  Everyone who will be around your baby should also be up-to-date on their vaccines before the baby comes. Adults (who are not pregnant) only need 1 dose of Tdap during adulthood.   [place] Pediatricians/Family Doctors:  [facility] [phone]                  [place] [phone] (usually not accepting new patients unless you have family there already, you are always welcome to call and ask)       [facility] [phone]       [facility] Pediatricians/Family Doctors:   [facility]: [phone]  [facility]: [phone]  [facility]: [place] Doctors:   [facility]: [facility]: [place]:  [place]: [phone]   Home Blood Pressure Monitoring for Patients   Your provider has recommended that you check your blood pressure (BP) at least once a week at home. If you do not have a blood pressure cuff at home, one will be provided for you. Contact your provider if you have not received your monitor within 1 week.   Helpful Tips for Accurate Home Blood Pressure Checks  . Don't smoke, exercise, or drink caffeine 30 minutes before checking your BP . Use the restroom before checking your BP (a full bladder can raise your pressure) . Relax in a comfortable upright chair . Feet on the ground . Left arm resting comfortably on a flat surface at the level of your heart . Legs uncrossed . Back supported . Sit quietly and don't talk .  Place the cuff on your bare arm . Adjust snuggly, so that only two fingertips can fit between your skin and the top of the cuff . Check 2 readings separated by at least one minute . Keep a log of your BP readings . For a visual, please reference this diagram: http://ccnc.care/bpdiagram  Provider Name: [facility]     Phone: [phone]  Zone 1: ALL CLEAR  Continue to monitor your symptoms:  . BP reading is less than 140 (top number) or less than 90 (bottom number)  . No right upper stomach pain . No headaches or seeing spots . No feeling nauseated or throwing up . No swelling in face and  hands  Zone 2: CAUTION Call your doctor's office for any of the following:  . BP reading is greater than 140 (top number) or greater than 90 (bottom number)  . Stomach pain under your ribs in the middle or right side . Headaches or seeing spots . Feeling nauseated or throwing up . Swelling in face and hands  Zone 3: EMERGENCY  Seek immediate medical care if you have any of the following:  . BP reading is greater than160 (top number) or greater than 110 (bottom number) . Severe headaches not improving with Tylenol . Serious difficulty catching your breath . Any worsening symptoms from Zone 2   Third Trimester of Pregnancy The third trimester is from week 29 through week 42, months 7 through 9. The third trimester is a time when the fetus is growing rapidly. At the end of the ninth month, the fetus is about 20 inches in length and weighs 6-10 pounds.  BODY CHANGES Your body goes through many changes during pregnancy. The changes vary from woman to woman.   Your weight will continue to increase. You can expect to gain 25-35 pounds (11-16 kg) by the end of the pregnancy.  You may begin to get stretch marks on your hips, abdomen, and breasts.  You may urinate more often because the fetus is moving lower into your pelvis and pressing on your bladder.  You may develop or continue to have heartburn as a result of your pregnancy.  You may develop constipation because certain hormones are causing the muscles that push waste through your intestines to slow down.  You may develop hemorrhoids or swollen, bulging veins (varicose veins).  You may have pelvic pain because of the weight gain and pregnancy hormones relaxing your joints between the bones in your pelvis. Backaches may result from overexertion of the muscles supporting your posture.  You may have changes in your hair. These can include thickening of your hair, rapid growth, and changes in texture. Some women also have hair loss during  or after pregnancy, or hair that feels dry or thin. Your hair will most likely return to normal after your baby is born.  Your breasts will continue to grow and be tender. A yellow discharge may leak from your breasts called colostrum.  Your belly button may stick out.  You may feel short of breath because of your expanding uterus.  You may notice the fetus "dropping," or moving lower in your abdomen.  You may have a bloody mucus discharge. This usually occurs a few days to a week before labor begins.  Your cervix becomes thin and soft (effaced) near your due date. WHAT TO EXPECT AT YOUR PRENATAL EXAMS  You will have prenatal exams every 2 weeks until week 36. Then, you will have weekly prenatal exams. During  a routine prenatal visit:  You will be weighed to make sure you and the fetus are growing normally.  Your blood pressure is taken.  Your abdomen will be measured to track your baby's growth.  The fetal heartbeat will be listened to.  Any test results from the previous visit will be discussed.  You may have a cervical check near your due date to see if you have effaced. At around 36 weeks, your caregiver will check your cervix. At the same time, your caregiver will also perform a test on the secretions of the vaginal tissue. This test is to determine if a type of bacteria, Group B streptococcus, is present. Your caregiver will explain this further. Your caregiver may ask you:  What your birth plan is.  How you are feeling.  If you are feeling the baby move.  If you have had any abnormal symptoms, such as leaking fluid, bleeding, severe headaches, or abdominal cramping.  If you have any questions. Other tests or screenings that may be performed during your third trimester include:  Blood tests that check for low iron levels (anemia).  Fetal testing to check the health, activity level, and growth of the fetus. Testing is done if you have certain medical conditions or if  there are problems during the pregnancy. FALSE LABOR You may feel small, irregular contractions that eventually go away. These are called Braxton Hicks contractions, or false labor. Contractions may last for hours, days, or even weeks before true labor sets in. If contractions come at regular intervals, intensify, or become painful, it is best to be seen by your caregiver.  SIGNS OF LABOR   Menstrual-like cramps.  Contractions that are 5 minutes apart or less.  Contractions that start on the top of the uterus and spread down to the lower abdomen and back.  A sense of increased pelvic pressure or back pain.  A watery or bloody mucus discharge that comes from the vagina. If you have any of these signs before the 37th week of pregnancy, call your caregiver right away. You need to go to the hospital to get checked immediately. HOME CARE INSTRUCTIONS   Avoid all smoking, herbs, alcohol, and unprescribed drugs. These chemicals affect the formation and growth of the baby.  Follow your caregiver's instructions regarding medicine use. There are medicines that are either safe or unsafe to take during pregnancy.  Exercise only as directed by your caregiver. Experiencing uterine cramps is a good sign to stop exercising.  Continue to eat regular, healthy meals.  Wear a good support bra for breast tenderness.  Do not use hot tubs, steam rooms, or saunas.  Wear your seat belt at all times when driving.  Avoid raw meat, uncooked cheese, cat litter boxes, and soil used by cats. These carry germs that can cause birth defects in the baby.  Take your prenatal vitamins.  Try taking a stool softener (if your caregiver approves) if you develop constipation. Eat more high-fiber foods, such as fresh vegetables or fruit and whole grains. Drink plenty of fluids to keep your urine clear or pale yellow.  Take warm sitz baths to soothe any pain or discomfort caused by hemorrhoids. Use hemorrhoid cream if your  caregiver approves.  If you develop varicose veins, wear support hose. Elevate your feet for 15 minutes, 3-4 times a day. Limit salt in your diet.  Avoid heavy lifting, wear low heal shoes, and practice good posture.  Rest a lot with your legs elevated if you  have leg cramps or low back pain.  Visit your dentist if you have not gone during your pregnancy. Use a soft toothbrush to brush your teeth and be gentle when you floss.  A sexual relationship may be continued unless your caregiver directs you otherwise.  Do not travel far distances unless it is absolutely necessary and only with the approval of your caregiver.  Take prenatal classes to understand, practice, and ask questions about the labor and delivery.  Make a trial run to the hospital.  Pack your hospital bag.  Prepare the baby's nursery.  Continue to go to all your prenatal visits as directed by your caregiver. SEEK MEDICAL CARE IF:  You are unsure if you are in labor or if your water has broken.  You have dizziness.  You have mild pelvic cramps, pelvic pressure, or nagging pain in your abdominal area.  You have persistent nausea, vomiting, or diarrhea.  You have a bad smelling vaginal discharge.  You have pain with urination. SEEK IMMEDIATE MEDICAL CARE IF:   You have a fever.  You are leaking fluid from your vagina.  You have spotting or bleeding from your vagina.  You have severe abdominal cramping or pain.  You have rapid weight loss or gain.  You have shortness of breath with chest pain.  You notice sudden or extreme swelling of your face, hands, ankles, feet, or legs.  You have not felt your baby move in over an hour.  You have severe headaches that do not go away with medicine.  You have vision changes. Document Released: 03/05/2001 Document Revised: 03/16/2013 Document Reviewed: 05/12/2012 [facility] Patient Information 2015 [place], [place]. This information is not intended to replace advice  given to you by your health care provider. Make sure you discuss any questions you have with your health care provider.

## 2019-01-11 ENCOUNTER — Other Ambulatory Visit: Payer: BC Managed Care – PPO

## 2019-01-11 ENCOUNTER — Other Ambulatory Visit: Payer: Self-pay | Admitting: Women's Health

## 2019-01-11 ENCOUNTER — Encounter: Payer: BC Managed Care – PPO | Admitting: Women's Health

## 2019-01-11 MED ORDER — FERROUS SULFATE 325 (65 FE) MG PO TABS: 325.0000 mg | ORAL_TABLET | Freq: Two times a day (BID) | ORAL | 3 refills | Status: DC

## 2019-01-12 LAB — HIV ANTIBODY (ROUTINE TESTING W REFLEX): HIV Screen 4th Generation wRfx: NONREACTIVE

## 2019-01-12 LAB — CBC
Hematocrit: 32.3 % — ABNORMAL LOW (ref 34.0–46.6)
Hemoglobin: 10.2 g/dL — ABNORMAL LOW (ref 11.1–15.9)
MCH: 27.7 pg (ref 26.6–33.0)
MCHC: 31.6 g/dL (ref 31.5–35.7)
MCV: 88 fL (ref 79–97)
Platelets: 199 10*3/uL (ref 150–450)
RBC: 3.68 x10E6/uL — ABNORMAL LOW (ref 3.77–5.28)
RDW: 13.1 % (ref 11.7–15.4)
WBC: 6.9 10*3/uL (ref 3.4–10.8)

## 2019-01-12 LAB — AB SCR+ANTIBODY ID: Antibody Screen: POSITIVE — AB

## 2019-01-12 LAB — ANTIBODY SCREEN

## 2019-01-12 LAB — GLUCOSE TOLERANCE, 2 HOURS W/ 1HR
Glucose, 1 hour: 128 mg/dL (ref 65–179)
Glucose, 2 hour: 95 mg/dL (ref 65–152)
Glucose, Fasting: 80 mg/dL (ref 65–91)

## 2019-01-12 LAB — RPR: RPR Ser Ql: NONREACTIVE

## 2019-01-13 ENCOUNTER — Telehealth: Payer: Self-pay | Admitting: *Deleted

## 2019-01-13 NOTE — Telephone Encounter (Signed)
Patient states that she is having uti symptoms. Wanted to see if she can come here to do a urine sample or if it was ok to go to her PCP.

## 2019-01-14 ENCOUNTER — Other Ambulatory Visit: Payer: Self-pay

## 2019-02-04 ENCOUNTER — Ambulatory Visit (INDEPENDENT_AMBULATORY_CARE_PROVIDER_SITE_OTHER): Payer: BC Managed Care – PPO

## 2019-02-04 ENCOUNTER — Other Ambulatory Visit: Payer: Self-pay

## 2019-02-04 ENCOUNTER — Ambulatory Visit (INDEPENDENT_AMBULATORY_CARE_PROVIDER_SITE_OTHER): Payer: BC Managed Care – PPO | Admitting: Obstetrics & Gynecology

## 2019-02-04 ENCOUNTER — Encounter: Payer: Self-pay | Admitting: Obstetrics & Gynecology

## 2019-02-04 VITALS — BP 138/82 | HR 94 | Wt 270.0 lb

## 2019-02-04 DIAGNOSIS — O10919 Unspecified pre-existing hypertension complicating pregnancy, unspecified trimester: Secondary | ICD-10-CM

## 2019-02-04 DIAGNOSIS — Z3A30 30 weeks gestation of pregnancy: Secondary | ICD-10-CM

## 2019-02-04 DIAGNOSIS — O0993 Supervision of high risk pregnancy, unspecified, third trimester: Secondary | ICD-10-CM

## 2019-02-04 DIAGNOSIS — Z362 Encounter for other antenatal screening follow-up: Secondary | ICD-10-CM

## 2019-02-04 DIAGNOSIS — Z1389 Encounter for screening for other disorder: Secondary | ICD-10-CM

## 2019-02-04 DIAGNOSIS — Z331 Pregnant state, incidental: Secondary | ICD-10-CM

## 2019-02-04 DIAGNOSIS — O10913 Unspecified pre-existing hypertension complicating pregnancy, third trimester: Secondary | ICD-10-CM

## 2019-02-04 DIAGNOSIS — O0992 Supervision of high risk pregnancy, unspecified, second trimester: Secondary | ICD-10-CM

## 2019-02-04 DIAGNOSIS — O0990 Supervision of high risk pregnancy, unspecified, unspecified trimester: Secondary | ICD-10-CM

## 2019-02-04 LAB — POCT URINALYSIS DIPSTICK OB
Blood, UA: NEGATIVE
Glucose, UA: NEGATIVE
Ketones, UA: NEGATIVE
Leukocytes, UA: NEGATIVE
Nitrite, UA: NEGATIVE

## 2019-02-04 NOTE — Progress Notes (Signed)
[place] 12+8 wks,cephalic,posterior placenta gr 1,fhr 157 bpm,afi 12 cm,left renal pelvic dilatation 8 mm,normal right kidney,efw 1683 g 63%

## 2019-02-04 NOTE — Progress Notes (Signed)
   HIGH-RISK PREGNANCY VISIT Patient name: Tammy Mcmahon MRN [id number]  Date of birth: 1980-12-11 Chief Complaint:   High Risk Gestation (U/S)  History of Present Illness:   Tammy Mcmahon is a 38 y.o. [phone] female at [redacted]w[redacted]d with an Estimated Date of Delivery: 04/13/19 being seen today for ongoing management of a high-risk pregnancy complicated by chronic hypertension currently on labetalol 100 mg BID.  Today she reports no complaints. Contractions: Not present.  .  Movement: Present. denies leaking of fluid.  Review of Systems:   Pertinent items are noted in HPI Denies abnormal vaginal discharge w/ itching/odor/irritation, headaches, visual changes, shortness of breath, chest pain, abdominal pain, severe nausea/vomiting, or problems with urination or bowel movements unless otherwise stated above. Pertinent History Reviewed:  Reviewed past medical,surgical, social, obstetrical and family history.  Reviewed problem list, medications and allergies. Physical Assessment:   Vitals:   02/04/19 1604  BP: 138/82  Pulse: 94  Weight: 270 lb (122.5 kg)  Body mass index is 38.74 kg/m.           Physical Examination:   General appearance: alert, well appearing, and in no distress  Mental status: alert, oriented to person, place, and time  Skin: warm & dry   Extremities: Edema: Trace    Cardiovascular: normal heart rate noted  Respiratory: normal respiratory effort, no distress  Abdomen: gravid, soft, non-tender  Pelvic: Cervical exam deferred         Fetal Status:     Movement: Present    Fetal Surveillance Testing today: BPP 8/8   Chaperone: n/a    Results for orders placed or performed in visit on 02/04/19 (from the past 24 hour(s))  POC Urinalysis Dipstick OB   Collection Time: 02/04/19  4:08 PM  Result Value Ref Range   Color, UA     Clarity, UA     Glucose, UA Negative Negative   Bilirubin, UA     Ketones, UA neg    Spec Grav, UA     Blood, UA neg    pH, UA     POC,PROTEIN,UA Trace Negative, Trace, Small (1+), Moderate (2+), Large (3+), 4+   Urobilinogen, UA     Nitrite, UA neg    Leukocytes, UA Negative Negative   Appearance     Odor      Assessment & Plan:  1) High-risk pregnancy [id number] at [redacted]w[redacted]d with an Estimated Date of Delivery: 04/13/19   2) CHTN, stable, labetalol 100 mg BID  3) Mild RPD, stable, minimal 8 mm  Meds: No orders of the defined types were placed in this encounter.   Labs/procedures today: sonogram  Treatment Plan:  Begin twice weekly surveillance at 32 weeks  Reviewed: Preterm labor symptoms and general obstetric precautions including but not limited to vaginal bleeding, contractions, leaking of fluid and fetal movement were reviewed in detail with the patient.  All questions were answered. Has home bp cuff. Rx faxed to . Check bp weekly, let us know if >140/90.   Follow-up: No follow-ups on file.  Orders Placed This Encounter  Procedures  . POC Urinalysis Dipstick OB   Florian Buff 02/04/2019 4:27 PM

## 2019-02-17 ENCOUNTER — Ambulatory Visit (INDEPENDENT_AMBULATORY_CARE_PROVIDER_SITE_OTHER): Payer: BC Managed Care – PPO | Admitting: Obstetrics and Gynecology

## 2019-02-17 ENCOUNTER — Other Ambulatory Visit: Payer: Self-pay

## 2019-02-17 VITALS — BP 136/85 | HR 98 | Wt 269.4 lb

## 2019-02-17 DIAGNOSIS — Z1389 Encounter for screening for other disorder: Secondary | ICD-10-CM

## 2019-02-17 DIAGNOSIS — Z331 Pregnant state, incidental: Secondary | ICD-10-CM

## 2019-02-17 DIAGNOSIS — Z3A32 32 weeks gestation of pregnancy: Secondary | ICD-10-CM

## 2019-02-17 DIAGNOSIS — Z3493 Encounter for supervision of normal pregnancy, unspecified, third trimester: Secondary | ICD-10-CM

## 2019-02-17 LAB — POCT URINALYSIS DIPSTICK OB
Blood, UA: NEGATIVE
Glucose, UA: NEGATIVE
Ketones, UA: NEGATIVE
Leukocytes, UA: NEGATIVE
Nitrite, UA: NEGATIVE
POC,PROTEIN,UA: NEGATIVE

## 2019-02-17 NOTE — Progress Notes (Signed)
Patient ID: Tammy Mcmahon, female   DOB: 25-Jan-1981, 38 y.o.   MRN: [id number]    [facility] PREGNANCY VISIT Patient name: Tammy Mcmahon MRN [id number]  Date of birth: [birth year] Chief Complaint:   Routine Prenatal Visit (NST)  History of Present Illness:   Tammy Mcmahon is a 38 y.o. [phone] female at [redacted]w[redacted]d with an Estimated Date of Delivery: 04/13/19 being seen today for ongoing management of a high-risk pregnancy complicated by chronic hypertension currently on 100 mg labetalol BID. She had HTN with second child as well and delivered , describing delivery as IOL at [redacted] wks gestation. She took no medicines while in the hospital. Today she reports heartburn. Has taken omeprazole but hasn't had any relief Contractions: Not present. Vag. Bleeding: None.  Movement: Present. denies leaking of fluid.  Review of Systems:   Pertinent items are noted in HPI Denies abnormal vaginal discharge w/ itching/odor/irritation, headaches, visual changes, shortness of breath, chest pain, abdominal pain, severe nausea/vomiting, or problems with urination or bowel movements unless otherwise stated above. Pertinent History Reviewed:  Reviewed past medical,surgical, social, obstetrical and family history.  Reviewed problem list, medications and allergies. Physical Assessment:   Vitals:   02/17/19 1529  BP: 136/85  Pulse: 98  Weight: 269 lb 6.4 oz (122.2 kg)  Body mass index is 38.65 kg/m.           Physical Examination:   General appearance: alert, well appearing, and in no distress  Mental status: normal mood, behavior, speech, dress, motor activity, and thought processes  Skin: warm & dry   Extremities:      Cardiovascular: normal heart rate noted  Respiratory: normal respiratory effort, no distress  Abdomen: gravid, soft, non-tender  Pelvic: Cervical exam deferred         Fetal Status:     Movement: Present    Fetal Surveillance Testing today: NST    Chaperone: Arnette Norris    Results for orders  placed or performed in visit on 02/17/19 (from the past 24 hour(s))  POC Urinalysis Dipstick OB   Collection Time: 02/17/19  3:28 PM  Result Value Ref Range   Color, UA     Clarity, UA     Glucose, UA Negative Negative   Bilirubin, UA     Ketones, UA n    Spec Grav, UA     Blood, UA n    pH, UA     POC,PROTEIN,UA Negative Negative, Trace, Small (1+), Moderate (2+), Large (3+), 4+   Urobilinogen, UA     Nitrite, UA n    Leukocytes, UA Negative Negative   Appearance     Odor      Assessment & Plan:  1) High-risk pregnancy [id number] at [redacted]w[redacted]d with an Estimated Date of Delivery: 04/13/19   2) CHTN, stable  3) heartburn, PRN 40 mg omeprazole  Meds: No orders of the defined types were placed in this encounter.  Labs/procedures today: NST  Treatment Plan:  02/22/2019 u/s BPP begin biweekly testing with bpp/ NST  Reviewed: Preterm labor symptoms and general obstetric precautions including but not limited to vaginal bleeding, contractions, leaking of fluid and fetal movement were reviewed in detail with the patient.  All questions were answered.   Follow-up: Return in about 5 days (around 02/22/2019) for BPP.  Orders Placed This Encounter  Procedures  . POC Urinalysis Dipstick OB   By signing my name below, I, Arnette Norris, attest that this documentation has been prepared under the direction and in the presence  of Jonnie Kind, MD. Electronically Signed: Samul Dada Medical Scribe. 02/17/19. 3:36 PM.  I personally performed the services described in this documentation, which was SCRIBED in my presence. The recorded information has been reviewed and considered accurate. It has been edited as necessary during review. Jonnie Kind, MD

## 2019-02-24 ENCOUNTER — Other Ambulatory Visit: Payer: Self-pay | Admitting: Obstetrics and Gynecology

## 2019-02-24 DIAGNOSIS — O10919 Unspecified pre-existing hypertension complicating pregnancy, unspecified trimester: Secondary | ICD-10-CM

## 2019-02-25 ENCOUNTER — Other Ambulatory Visit: Payer: Self-pay

## 2019-02-25 ENCOUNTER — Ambulatory Visit (INDEPENDENT_AMBULATORY_CARE_PROVIDER_SITE_OTHER): Payer: BC Managed Care – PPO

## 2019-02-25 ENCOUNTER — Ambulatory Visit (INDEPENDENT_AMBULATORY_CARE_PROVIDER_SITE_OTHER): Payer: BC Managed Care – PPO | Admitting: Advanced Practice Midwife

## 2019-02-25 VITALS — BP 124/76 | HR 90 | Wt 267.0 lb

## 2019-02-25 DIAGNOSIS — O0993 Supervision of high risk pregnancy, unspecified, third trimester: Secondary | ICD-10-CM

## 2019-02-25 DIAGNOSIS — Z331 Pregnant state, incidental: Secondary | ICD-10-CM

## 2019-02-25 DIAGNOSIS — Z1389 Encounter for screening for other disorder: Secondary | ICD-10-CM

## 2019-02-25 DIAGNOSIS — Z6791 Unspecified blood type, Rh negative: Secondary | ICD-10-CM

## 2019-02-25 DIAGNOSIS — R9341 Abnormal radiologic findings on diagnostic imaging of renal pelvis, ureter, or bladder: Secondary | ICD-10-CM

## 2019-02-25 DIAGNOSIS — O360130 Maternal care for anti-D [Rh] antibodies, third trimester, not applicable or unspecified: Secondary | ICD-10-CM | POA: Diagnosis not present

## 2019-02-25 DIAGNOSIS — O0990 Supervision of high risk pregnancy, unspecified, unspecified trimester: Secondary | ICD-10-CM

## 2019-02-25 DIAGNOSIS — O26893 Other specified pregnancy related conditions, third trimester: Secondary | ICD-10-CM

## 2019-02-25 DIAGNOSIS — O10919 Unspecified pre-existing hypertension complicating pregnancy, unspecified trimester: Secondary | ICD-10-CM

## 2019-02-25 DIAGNOSIS — O10913 Unspecified pre-existing hypertension complicating pregnancy, third trimester: Secondary | ICD-10-CM | POA: Diagnosis not present

## 2019-02-25 DIAGNOSIS — Z3A33 33 weeks gestation of pregnancy: Secondary | ICD-10-CM | POA: Diagnosis not present

## 2019-02-25 LAB — POCT URINALYSIS DIPSTICK OB
Blood, UA: NEGATIVE
Glucose, UA: NEGATIVE
Ketones, UA: NEGATIVE
Leukocytes, UA: NEGATIVE
Nitrite, UA: NEGATIVE

## 2019-02-25 MED ORDER — OMEPRAZOLE 20 MG PO CPDR: 20.0000 mg | DELAYED_RELEASE_CAPSULE | Freq: Two times a day (BID) | ORAL | 6 refills | Status: AC

## 2019-02-25 NOTE — Progress Notes (Signed)
[place] 67+0 wks,cephalic,BPP 1/4,DCVUDTHYH placenta gr 3,AFI 12.6 cm,mild dilatation of left renal pelvis 8 mm N/C,right renal pelvis 5 mm WNL,FHR 142 bpm,RI .60,.66,.54=52%

## 2019-02-25 NOTE — Patient Instructions (Signed)
Tammy Mcmahon, I greatly value your feedback.  If you receive a survey following your visit with [place] today, we appreciate you taking the time to fill it out.  Thanks, Tammy Mcmahon, CNM   [place]!!! It is now [place] at [facility] ([place], [place] [address]) Entrance located off of [address] parking   Go to [email or web address] to register for FREE online childbirth classes    Call the office [phone]) or go to [facility] if:  You begin to have strong, frequent contractions  Your water breaks.  Sometimes it is a big gush of fluid, sometimes it is just a trickle that keeps getting your panties wet or running down your legs  You have vaginal bleeding.  It is normal to have a small amount of spotting if your cervix was checked.   You don't feel your baby moving like normal.  If you don't, get you something to eat and drink and lay down and focus on feeling your baby move.  You should feel at least 10 movements in 2 hours.  If you don't, you should call the office or go to [facility].    Tdap Vaccine  It is recommended that you get the Tdap vaccine during the third trimester of EACH pregnancy to help protect your baby from getting pertussis (whooping cough)  27-36 weeks is the BEST time to do this so that you can pass the protection on to your baby. During pregnancy is better than after pregnancy, but if you are unable to get it during pregnancy it will be offered at the hospital.   You will be offered this vaccine in the office after 27 weeks. If you do not have health insurance, you can get this vaccine at the health department or your family doctor  Everyone who will be around your baby should also be up-to-date on their vaccines. Adults (who are not pregnant) only need 1 dose of Tdap during adulthood.   Third Trimester of Pregnancy The third trimester is from week 29 through week 42, months 7 through 9. The  third trimester is a time when the fetus is growing rapidly. At the end of the ninth month, the fetus is about 20 inches in length and weighs 6-10 pounds.  BODY CHANGES Your body goes through many changes during pregnancy. The changes vary from woman to woman.   Your weight will continue to increase. You can expect to gain 25-35 pounds (11-16 kg) by the end of the pregnancy.  You may begin to get stretch marks on your hips, abdomen, and breasts.  You may urinate more often because the fetus is moving lower into your pelvis and pressing on your bladder.  You may develop or continue to have heartburn as a result of your pregnancy.  You may develop constipation because certain hormones are causing the muscles that push waste through your intestines to slow down.  You may develop hemorrhoids or swollen, bulging veins (varicose veins).  You may have pelvic pain because of the weight gain and pregnancy hormones relaxing your joints between the bones in your pelvis. Backaches may result from overexertion of the muscles supporting your posture.  You may have changes in your hair. These can include thickening of your hair, rapid growth, and changes in texture. Some women also have hair loss during or after pregnancy, or hair that feels dry or thin. Your hair will most likely return to normal  after your baby is born.  Your breasts will continue to grow and be tender. A yellow discharge may leak from your breasts called colostrum.  Your belly button may stick out.  You may feel short of breath because of your expanding uterus.  You may notice the fetus "dropping," or moving lower in your abdomen.  You may have a bloody mucus discharge. This usually occurs a few days to a week before labor begins.  Your cervix becomes thin and soft (effaced) near your due date. WHAT TO EXPECT AT YOUR PRENATAL EXAMS  You will have prenatal exams every 2 weeks until week 36. Then, you will have weekly prenatal  exams. During a routine prenatal visit:  You will be weighed to make sure you and the fetus are growing normally.  Your blood pressure is taken.  Your abdomen will be measured to track your baby's growth.  The fetal heartbeat will be listened to.  Any test results from the previous visit will be discussed.  You may have a cervical check near your due date to see if you have effaced. At around 36 weeks, your caregiver will check your cervix. At the same time, your caregiver will also perform a test on the secretions of the vaginal tissue. This test is to determine if a type of bacteria, Group B streptococcus, is present. Your caregiver will explain this further. Your caregiver may ask you:  What your birth plan is.  How you are feeling.  If you are feeling the baby move.  If you have had any abnormal symptoms, such as leaking fluid, bleeding, severe headaches, or abdominal cramping.  If you have any questions. Other tests or screenings that may be performed during your third trimester include:  Blood tests that check for low iron levels (anemia).  Fetal testing to check the health, activity level, and growth of the fetus. Testing is done if you have certain medical conditions or if there are problems during the pregnancy. FALSE LABOR You may feel small, irregular contractions that eventually go away. These are called Braxton Hicks contractions, or false labor. Contractions may last for hours, days, or even weeks before true labor sets in. If contractions come at regular intervals, intensify, or become painful, it is best to be seen by your caregiver.  SIGNS OF LABOR   Menstrual-like cramps.  Contractions that are 5 minutes apart or less.  Contractions that start on the top of the uterus and spread down to the lower abdomen and back.  A sense of increased pelvic pressure or back pain.  A watery or bloody mucus discharge that comes from the vagina. If you have any of these  signs before the 37th week of pregnancy, call your caregiver right away. You need to go to the hospital to get checked immediately. HOME CARE INSTRUCTIONS   Avoid all smoking, herbs, alcohol, and unprescribed drugs. These chemicals affect the formation and growth of the baby.  Follow your caregiver's instructions regarding medicine use. There are medicines that are either safe or unsafe to take during pregnancy.  Exercise only as directed by your caregiver. Experiencing uterine cramps is a good sign to stop exercising.  Continue to eat regular, healthy meals.  Wear a good support bra for breast tenderness.  Do not use hot tubs, steam rooms, or saunas.  Wear your seat belt at all times when driving.  Avoid raw meat, uncooked cheese, cat litter boxes, and soil used by cats. These carry germs that can cause  birth defects in the baby.  Take your prenatal vitamins.  Try taking a stool softener (if your caregiver approves) if you develop constipation. Eat more high-fiber foods, such as fresh vegetables or fruit and whole grains. Drink plenty of fluids to keep your urine clear or pale yellow.  Take warm sitz baths to soothe any pain or discomfort caused by hemorrhoids. Use hemorrhoid cream if your caregiver approves.  If you develop varicose veins, wear support hose. Elevate your feet for 15 minutes, 3-4 times a day. Limit salt in your diet.  Avoid heavy lifting, wear low heal shoes, and practice good posture.  Rest a lot with your legs elevated if you have leg cramps or low back pain.  Visit your dentist if you have not gone during your pregnancy. Use a soft toothbrush to brush your teeth and be gentle when you floss.  A sexual relationship may be continued unless your caregiver directs you otherwise.  Do not travel far distances unless it is absolutely necessary and only with the approval of your caregiver.  Take prenatal classes to understand, practice, and ask questions about the  labor and delivery.  Make a trial run to the hospital.  Pack your hospital bag.  Prepare the baby's nursery.  Continue to go to all your prenatal visits as directed by your caregiver. SEEK MEDICAL CARE IF:  You are unsure if you are in labor or if your water has broken.  You have dizziness.  You have mild pelvic cramps, pelvic pressure, or nagging pain in your abdominal area.  You have persistent nausea, vomiting, or diarrhea.  You have a bad smelling vaginal discharge.  You have pain with urination. SEEK IMMEDIATE MEDICAL CARE IF:   You have a fever.  You are leaking fluid from your vagina.  You have spotting or bleeding from your vagina.  You have severe abdominal cramping or pain.  You have rapid weight loss or gain.  You have shortness of breath with chest pain.  You notice sudden or extreme swelling of your face, hands, ankles, feet, or legs.  You have not felt your baby move in over an hour.  You have severe headaches that do not go away with medicine.  You have vision changes. Document Released: 03/05/2001 Document Revised: 03/16/2013 Document Reviewed: 05/12/2012 [facility] Patient Information 2015 [place], [place]. This information is not intended to replace advice given to you by your health care provider. Make sure you discuss any questions you have with your health care provider.

## 2019-02-25 NOTE — Progress Notes (Signed)
HIGH-RISK PREGNANCY VISIT Patient name: Tammy Mcmahon MRN [id number]  Date of birth: 10/19/1980 Chief Complaint:   Routine Prenatal Visit  History of Present Illness:   Tammy Mcmahon is a 38 y.o. [id number] female at [redacted]w[redacted]d with an Estimated Date of Delivery: 04/13/19 being seen today for ongoing management of a high-risk pregnancy complicated by [employer] currently on Labetalol Today she reports increased heartburn. May take prilosec BID Contractions: Not present. Vag. Bleeding: None.  Movement: Present. denies leaking of fluid.  Review of Systems:   Pertinent items are noted in HPI Denies abnormal vaginal discharge w/ itching/odor/irritation, headaches, visual changes, shortness of breath, chest pain, abdominal pain, severe nausea/vomiting, or problems with urination or bowel movements unless otherwise stated above. Pertinent History Reviewed:  Reviewed past medical,surgical, social, obstetrical and family history.  Reviewed problem list, medications and allergies. Physical Assessment:   Vitals:   02/25/19 1552  BP: 124/76  Pulse: 90  Weight: 267 lb (121.1 kg)  Body mass index is 38.31 kg/m.           Physical Examination:   General appearance: alert, well appearing, and in no distress  Mental status: alert, oriented to person, place, and time  Skin: warm & dry   Extremities: Edema: Trace    Cardiovascular: normal heart rate noted  Respiratory: normal respiratory effort, no distress  Abdomen: gravid, soft, non-tender  Pelvic: Cervical exam deferred         Fetal Status:     Movement: Present    Fetal Surveillance Testing today: [place] 10+1 wks,cephalic,BPP 7/5,ZWCHENIDP placenta gr 3,AFI 12.6 cm,mild dilatation of left renal pelvis 8 mm N/C,right renal pelvis 5 mm WNL,FHR 142 bpm,RI .60,.66,.54=52%   Results for orders placed or performed in visit on 02/25/19 (from the past 24 hour(s))  POC Urinalysis Dipstick OB   Collection Time: 02/25/19  3:45 PM  Result Value Ref Range   Color, UA      Clarity, UA     Glucose, UA Negative Negative   Bilirubin, UA     Ketones, UA neg    Spec Grav, UA     Blood, UA neg    pH, UA     POC,PROTEIN,UA Small (1+) Negative, Trace, Small (1+), Moderate (2+), Large (3+), 4+   Urobilinogen, UA     Nitrite, UA neg    Leukocytes, UA Negative Negative   Appearance     Odor      Assessment & Plan:  1) High-risk pregnancy [id number] at [redacted]w[redacted]d with an Estimated Date of Delivery: 04/13/19   2) CHTN, stable Treatment plan  Continue labetalol BID, asa and twice weekly testing  3) Renal pelvis mild 74mm,   Treatment plan  Meds:  Meds ordered this encounter  Medications  . omeprazole (PRILOSEC) 20 MG capsule    Sig: Take 1 capsule (20 mg total) by mouth 2 (two) times daily before a meal.    Dispense:  60 capsule    Refill:  6    Order Specific Question:   Supervising Provider    Answer:   Florian Buff [[id number]]    Labs/procedures today:rhogam   Reviewed: Preterm labor symptoms and general obstetric precautions including but not limited to vaginal bleeding, contractions, leaking of fluid and fetal movement were reviewed in detail with the patient.  All questions were answered.  Follow-up: Return for Tues and Fridays (one a nurse only NST and one an US/HROB)  it doesnt matter which one. .  Future Appointments  Date Time Provider  Department Center  03/02/2019  3:45 PM [facility] [place] [facility] None  03/05/2019 12:10 PM Lazaro Arms, MD [facility]  03/09/2019  3:30 PM [facility] [place] [facility] None  03/09/2019  4:10 PM Federico Flake, MD [facility]  03/12/2019 12:10 PM [facility] NURSE [facility]  03/23/2019  3:30 PM [facility] [place] [facility] None  03/23/2019  4:10 PM Lazaro Arms, MD [facility]  03/25/2019 11:30 AM [facility] NURSE [facility]  03/30/2019  3:30 PM [facility] [place] [facility] None  03/30/2019  4:10 PM Lazaro Arms, MD [facility]  04/02/2019 12:10 PM [facility] NURSE [facility]   04/06/2019  3:30 PM [facility] [place] [facility] None  04/06/2019  4:10 PM Lazaro Arms, MD [facility]  04/09/2019 12:10 PM [facility] NURSE [facility]    Orders Placed This Encounter  Procedures  . RHO (D) Immune Globulin  . POC Urinalysis Dipstick OB   Jacklyn Shell DNP, CNM 02/25/2019 4:29 PM

## 2019-03-01 ENCOUNTER — Other Ambulatory Visit: Payer: Self-pay | Admitting: Advanced Practice Midwife

## 2019-03-01 DIAGNOSIS — O10919 Unspecified pre-existing hypertension complicating pregnancy, unspecified trimester: Secondary | ICD-10-CM

## 2019-03-02 ENCOUNTER — Other Ambulatory Visit: Payer: Self-pay

## 2019-03-02 ENCOUNTER — Ambulatory Visit (INDEPENDENT_AMBULATORY_CARE_PROVIDER_SITE_OTHER): Payer: BC Managed Care – PPO

## 2019-03-02 DIAGNOSIS — O10919 Unspecified pre-existing hypertension complicating pregnancy, unspecified trimester: Secondary | ICD-10-CM

## 2019-03-02 DIAGNOSIS — Z3A34 34 weeks gestation of pregnancy: Secondary | ICD-10-CM | POA: Diagnosis not present

## 2019-03-02 DIAGNOSIS — O10913 Unspecified pre-existing hypertension complicating pregnancy, third trimester: Secondary | ICD-10-CM

## 2019-03-02 DIAGNOSIS — Z362 Encounter for other antenatal screening follow-up: Secondary | ICD-10-CM

## 2019-03-02 DIAGNOSIS — R9341 Abnormal radiologic findings on diagnostic imaging of renal pelvis, ureter, or bladder: Secondary | ICD-10-CM

## 2019-03-02 NOTE — Progress Notes (Signed)
[place] 34 wks,cephalic,BPP 5/3,MIW 803 bpm,right renal pelvis 6 mm (wnl),left renal pelvis 4.8 mm (wnl),posterior placenta gr 3,afi 15.6 cm,RI .60,.58,.55,.55=34%,EFW 2683 g 83%

## 2019-03-05 ENCOUNTER — Ambulatory Visit (INDEPENDENT_AMBULATORY_CARE_PROVIDER_SITE_OTHER): Payer: BC Managed Care – PPO | Admitting: Obstetrics & Gynecology

## 2019-03-05 ENCOUNTER — Other Ambulatory Visit: Payer: Self-pay

## 2019-03-05 ENCOUNTER — Encounter: Payer: Self-pay | Admitting: Obstetrics & Gynecology

## 2019-03-05 VITALS — BP 122/71 | HR 97 | Wt 269.0 lb

## 2019-03-05 DIAGNOSIS — O10013 Pre-existing essential hypertension complicating pregnancy, third trimester: Secondary | ICD-10-CM | POA: Diagnosis not present

## 2019-03-05 DIAGNOSIS — O0990 Supervision of high risk pregnancy, unspecified, unspecified trimester: Secondary | ICD-10-CM

## 2019-03-05 DIAGNOSIS — K8000 Calculus of gallbladder with acute cholecystitis without obstruction: Secondary | ICD-10-CM

## 2019-03-05 DIAGNOSIS — Z331 Pregnant state, incidental: Secondary | ICD-10-CM

## 2019-03-05 DIAGNOSIS — Z3A34 34 weeks gestation of pregnancy: Secondary | ICD-10-CM | POA: Diagnosis not present

## 2019-03-05 DIAGNOSIS — Z1389 Encounter for screening for other disorder: Secondary | ICD-10-CM

## 2019-03-05 DIAGNOSIS — R9341 Abnormal radiologic findings on diagnostic imaging of renal pelvis, ureter, or bladder: Secondary | ICD-10-CM

## 2019-03-05 DIAGNOSIS — O0993 Supervision of high risk pregnancy, unspecified, third trimester: Secondary | ICD-10-CM

## 2019-03-05 LAB — POCT URINALYSIS DIPSTICK OB
Blood, UA: NEGATIVE
Glucose, UA: NEGATIVE
Ketones, UA: NEGATIVE
Leukocytes, UA: NEGATIVE
Nitrite, UA: NEGATIVE

## 2019-03-05 NOTE — Progress Notes (Signed)
HIGH-RISK PREGNANCY VISIT Patient name: Tammy Mcmahon MRN [id number]  Date of birth: 11/02/1980 Chief Complaint:   High Risk Gestation (NST)  History of Present Illness:   Tammy Mcmahon is a 38 y.o. [phone] female at [redacted]w[redacted]d with an Estimated Date of Delivery: 04/13/19 being seen today for ongoing management of a high-risk pregnancy complicated by chronic hypertension currently on labetalol 100 mg BID.  Today she reports no complaints. Contractions: Not present.  .  Movement: Present. denies leaking of fluid.  Review of Systems:   Pertinent items are noted in HPI Denies abnormal vaginal discharge w/ itching/odor/irritation, headaches, visual changes, shortness of breath, chest pain, abdominal pain, severe nausea/vomiting, or problems with urination or bowel movements unless otherwise stated above. Pertinent History Reviewed:  Reviewed past medical,surgical, social, obstetrical and family history.  Reviewed problem list, medications and allergies. Physical Assessment:   Vitals:   03/05/19 1215  BP: 122/71  Pulse: 97  Weight: 269 lb (122 kg)  Body mass index is 38.6 kg/m.           Physical Examination:   General appearance: alert, well appearing, and in no distress  Mental status: alert, oriented to person, place, and time  Skin: warm & dry   Extremities: Edema: None    Cardiovascular: normal heart rate noted  Respiratory: normal respiratory effort, no distress  Abdomen: gravid, soft, non-tender  Pelvic: Cervical exam deferred         Fetal Status:   Fundal Height: 37 cm Movement: Present    Fetal Surveillance Testing today: Reactive NST  Tammy Mcmahon is at [redacted]w[redacted]d Estimated Date of Delivery: 04/13/19  NST being performed due to chronic hypertension  Today the NST is Reactive  Fetal Monitoring:  Baseline: 135 bpm, Variability: Good {> 6 bpm), Accelerations: Reactive and Decelerations: Absent   reactive  The accelerations are >15 bpm and more than 2 in 20 minutes  Final  diagnosis:  Reactive NST  Florian Buff, MD      Chaperone: n/a    Results for orders placed or performed in visit on 03/05/19 (from the past 24 hour(s))  POC Urinalysis Dipstick OB   Collection Time: 03/05/19 12:25 PM  Result Value Ref Range   Color, UA     Clarity, UA     Glucose, UA Negative Negative   Bilirubin, UA     Ketones, UA neg    Spec Grav, UA     Blood, UA neg    pH, UA     POC,PROTEIN,UA Trace Negative, Trace, Small (1+), Moderate (2+), Large (3+), 4+   Urobilinogen, UA     Nitrite, UA neg    Leukocytes, UA Negative Negative   Appearance     Odor      Assessment & Plan:  1) High-risk pregnancy [id number] at [redacted]w[redacted]d with an Estimated Date of Delivery: 04/13/19   2) CHTN, stable, good BP on labetalol 100 BID    Meds: No orders of the defined types were placed in this encounter.   Labs/procedures today: reactive NST  Treatment Plan:  Twice weekly surveillance, sonogram alternating with NST, induction at 39 weeks or as clinically indicated   Reviewed: Term labor symptoms and general obstetric precautions including but not limited to vaginal bleeding, contractions, leaking of fluid and fetal movement were reviewed in detail with the patient.  All questions were answered. Has home bp cuff. Rx faxed to . Check bp weekly, let us know if >140/90.   Follow-up: Return in about 4  days (around 03/09/2019) for BPP/sono, HROB.  Orders Placed This Encounter  Procedures  . POC Urinalysis Dipstick OB   Lilibeth Opie H Kearston Putman  03/05/2019 1:00 PM

## 2019-03-08 ENCOUNTER — Other Ambulatory Visit: Payer: Self-pay | Admitting: Obstetrics & Gynecology

## 2019-03-08 DIAGNOSIS — O10919 Unspecified pre-existing hypertension complicating pregnancy, unspecified trimester: Secondary | ICD-10-CM

## 2019-03-09 ENCOUNTER — Other Ambulatory Visit: Payer: Self-pay

## 2019-03-09 ENCOUNTER — Ambulatory Visit (INDEPENDENT_AMBULATORY_CARE_PROVIDER_SITE_OTHER): Payer: BC Managed Care – PPO

## 2019-03-09 ENCOUNTER — Ambulatory Visit (INDEPENDENT_AMBULATORY_CARE_PROVIDER_SITE_OTHER): Payer: BC Managed Care – PPO | Admitting: Obstetrics & Gynecology

## 2019-03-09 ENCOUNTER — Encounter: Payer: Self-pay | Admitting: Obstetrics & Gynecology

## 2019-03-09 VITALS — BP 133/88 | HR 92 | Wt 272.0 lb

## 2019-03-09 DIAGNOSIS — O10919 Unspecified pre-existing hypertension complicating pregnancy, unspecified trimester: Secondary | ICD-10-CM

## 2019-03-09 DIAGNOSIS — Z3A35 35 weeks gestation of pregnancy: Secondary | ICD-10-CM

## 2019-03-09 DIAGNOSIS — O0993 Supervision of high risk pregnancy, unspecified, third trimester: Secondary | ICD-10-CM

## 2019-03-09 DIAGNOSIS — O0990 Supervision of high risk pregnancy, unspecified, unspecified trimester: Secondary | ICD-10-CM

## 2019-03-09 DIAGNOSIS — R9341 Abnormal radiologic findings on diagnostic imaging of renal pelvis, ureter, or bladder: Secondary | ICD-10-CM

## 2019-03-09 DIAGNOSIS — O10913 Unspecified pre-existing hypertension complicating pregnancy, third trimester: Secondary | ICD-10-CM

## 2019-03-09 NOTE — Progress Notes (Signed)
[place] 35 wks,cephalic,posterior placenta gr 3,fhr 159 bpm,BPP 8/8,AFI 13.6 cm,left renal pelvis 5.1 mm (wnl),right renal pelvis 5.5 mm (wnl),RI .59,.65,.60,.58=66%

## 2019-03-09 NOTE — Progress Notes (Signed)
   HIGH-RISK PREGNANCY VISIT Patient name: Tammy Mcmahon MRN [id number]  Date of birth: 1981-01-27 Chief Complaint:   Routine Prenatal Visit  History of Present Illness:   Tammy Mcmahon is a 38 y.o. [id number] female at [redacted]w[redacted]d with an Estimated Date of Delivery: 04/13/19 being seen today for ongoing management of a high-risk pregnancy complicated by chronic hypertension currently on labetlol.  Today she reports no complaints. Contractions: Not present. Vag. Bleeding: None.  Movement: Present. denies leaking of fluid.  Review of Systems:   Pertinent items are noted in HPI Denies abnormal vaginal discharge w/ itching/odor/irritation, headaches, visual changes, shortness of breath, chest pain, abdominal pain, severe nausea/vomiting, or problems with urination or bowel movements unless otherwise stated above. Pertinent History Reviewed:  Reviewed past medical,surgical, social, obstetrical and family history.  Reviewed problem list, medications and allergies. Physical Assessment:   Vitals:   03/09/19 1609  BP: 133/88  Pulse: 92  Weight: 272 lb (123.4 kg)  Body mass index is 39.03 kg/m.           Physical Examination:   General appearance: alert, well appearing, and in no distress  Mental status: alert, oriented to person, place, and time  Skin: warm & dry   Extremities: Edema: None    Cardiovascular: normal heart rate noted  Respiratory: normal respiratory effort, no distress  Abdomen: gravid, soft, non-tender  Pelvic: Cervical exam deferred         Fetal Status:     Movement: Present    Fetal Surveillance Testing today: BPP 8/8   Chaperone: n/a    No results found for this or any previous visit (from the past 24 hour(s)).  Assessment & Plan:  1) High-risk pregnancy [id number] at [redacted]w[redacted]d with an Estimated Date of Delivery: 04/13/19   2) CHTN, stable, labetalol 100 mg BID, with reassuring surveillance today    Meds: No orders of the defined types were placed in this encounter.    Labs/procedures today: sonogram BPP 8/8 with normal Dopplers  Treatment Plan:  Twice weekly surveillance  Reviewed: Term labor symptoms and general obstetric precautions including but not limited to vaginal bleeding, contractions, leaking of fluid and fetal movement were reviewed in detail with the patient.  All questions were answered. Has home bp cuff. Rx faxed to . Check bp weekly, let us know if >140/90.   Follow-up: No follow-ups on file.  No orders of the defined types were placed in this encounter.  Florian Buff  03/09/2019 4:37 PM

## 2019-03-12 ENCOUNTER — Other Ambulatory Visit: Payer: Self-pay | Admitting: Obstetrics & Gynecology

## 2019-03-12 ENCOUNTER — Other Ambulatory Visit: Payer: BC Managed Care – PPO

## 2019-03-12 DIAGNOSIS — O10919 Unspecified pre-existing hypertension complicating pregnancy, unspecified trimester: Secondary | ICD-10-CM

## 2019-03-16 ENCOUNTER — Ambulatory Visit (INDEPENDENT_AMBULATORY_CARE_PROVIDER_SITE_OTHER): Payer: BC Managed Care – PPO | Admitting: Obstetrics and Gynecology

## 2019-03-16 ENCOUNTER — Other Ambulatory Visit: Payer: Self-pay

## 2019-03-16 ENCOUNTER — Encounter: Payer: Self-pay | Admitting: Obstetrics and Gynecology

## 2019-03-16 VITALS — BP 128/82 | HR 87 | Wt 275.6 lb

## 2019-03-16 DIAGNOSIS — O0990 Supervision of high risk pregnancy, unspecified, unspecified trimester: Secondary | ICD-10-CM

## 2019-03-16 DIAGNOSIS — K8000 Calculus of gallbladder with acute cholecystitis without obstruction: Secondary | ICD-10-CM

## 2019-03-16 DIAGNOSIS — Z331 Pregnant state, incidental: Secondary | ICD-10-CM

## 2019-03-16 DIAGNOSIS — Z3A36 36 weeks gestation of pregnancy: Secondary | ICD-10-CM | POA: Diagnosis not present

## 2019-03-16 DIAGNOSIS — O0993 Supervision of high risk pregnancy, unspecified, third trimester: Secondary | ICD-10-CM

## 2019-03-16 DIAGNOSIS — R9341 Abnormal radiologic findings on diagnostic imaging of renal pelvis, ureter, or bladder: Secondary | ICD-10-CM

## 2019-03-16 DIAGNOSIS — O10919 Unspecified pre-existing hypertension complicating pregnancy, unspecified trimester: Secondary | ICD-10-CM

## 2019-03-16 DIAGNOSIS — O10913 Unspecified pre-existing hypertension complicating pregnancy, third trimester: Secondary | ICD-10-CM | POA: Diagnosis not present

## 2019-03-16 DIAGNOSIS — Z1389 Encounter for screening for other disorder: Secondary | ICD-10-CM

## 2019-03-16 LAB — POCT URINALYSIS DIPSTICK OB
Blood, UA: NEGATIVE
Glucose, UA: NEGATIVE
Ketones, UA: NEGATIVE
Leukocytes, UA: NEGATIVE
Nitrite, UA: NEGATIVE

## 2019-03-16 NOTE — Progress Notes (Signed)
   HIGH-RISK PREGNANCY VISIT Patient name: Tammy Mcmahon MRN [id number]  Date of birth: Mar 04, 1981 Chief Complaint:   Routine Prenatal Visit (NST)  History of Present Illness:   Tammy Mcmahon is a 38 y.o. [phone] female at [redacted]w[redacted]d with an Estimated Date of Delivery: 04/13/19 being seen today for ongoing management of a high-risk pregnancy complicated by chronic hypertension currently on Labetalol.  Today she reports no complaints. Contractions: Irregular. Vag. Bleeding: None.  Movement: Present. denies leaking of fluid.  BP's at home are in 329'J systolic. Review of Systems:   Pertinent items are noted in HPI Denies abnormal vaginal discharge w/ itching/odor/irritation, headaches, visual changes, shortness of breath, chest pain, abdominal pain, severe nausea/vomiting, or problems with urination or bowel movements unless otherwise stated above. Pertinent History Reviewed:  Reviewed past medical,surgical, social, obstetrical and family history.  Reviewed problem list, medications and allergies. Physical Assessment:   Vitals:   03/16/19 1555  BP: 128/82  Pulse: 87  Weight: 275 lb 9.6 oz (125 kg)  Body mass index is 39.54 kg/m.           Physical Examination:   General appearance: alert, well appearing, and in no distress and overweight  Mental status: alert, oriented to person, place, and time, normal mood, behavior, speech, dress, motor activity, and thought processes  Skin: warm & dry   Extremities: Edema: None    Cardiovascular: normal heart rate noted  Respiratory: normal respiratory effort, no distress  Abdomen: gravid, soft, non-tender  Pelvic: Cervical exam deferred         Fetal Status:     Movement: Present    Fetal Surveillance Testing today: NST   Chaperone: n/a    Results for orders placed or performed in visit on 03/16/19 (from the past 24 hour(s))  POC Urinalysis Dipstick OB   Collection Time: 03/16/19  4:03 PM  Result Value Ref Range   Color, UA     Clarity, UA       Glucose, UA Negative Negative   Bilirubin, UA     Ketones, UA neg    Spec Grav, UA     Blood, UA neg    pH, UA     POC,PROTEIN,UA Trace Negative, Trace, Small (1+), Moderate (2+), Large (3+), 4+   Urobilinogen, UA     Nitrite, UA neg    Leukocytes, UA Negative Negative   Appearance     Odor      Assessment & Plan:  1) High-risk pregnancy [id number] at [redacted]w[redacted]d with an Estimated Date of Delivery: 04/13/19   2) CHTN , stable on Biweekly testing.scheduled for 10 am Saturday 12/26 at [facility]   Meds: stable on Labetalol 100 bid  Labs/procedures today: none  Treatment Plan:  Biweekly NST this week,  NST/BPP alternating next week. Reviewed:  labor symptoms and general obstetric precautions including but not limited to vaginal bleeding, contractions, leaking of fluid and fetal movement were reviewed with the patient.  All questions were answered. has home bp cuff. . Check bp daily to continue, let us know if >140/90.   Follow-up: 4 d [facility] , 1 wk here.  Orders Placed This Encounter  Procedures  . POC Urinalysis Dipstick OB   Jonnie Kind MD. 03/16/2019 4:29 PM

## 2019-03-20 ENCOUNTER — Encounter (HOSPITAL_COMMUNITY): Payer: Self-pay | Admitting: Obstetrics and Gynecology

## 2019-03-20 ENCOUNTER — Other Ambulatory Visit: Payer: Self-pay

## 2019-03-20 ENCOUNTER — Inpatient Hospital Stay (HOSPITAL_COMMUNITY)
Admission: AD | Admit: 2019-03-20 | Discharge: 2019-03-20 | Disposition: A | Discharge status: Home / Self Care | Payer: BC Managed Care – PPO | Attending: Obstetrics and Gynecology | Admitting: Obstetrics and Gynecology

## 2019-03-20 DIAGNOSIS — Z3A36 36 weeks gestation of pregnancy: Secondary | ICD-10-CM | POA: Insufficient documentation

## 2019-03-20 DIAGNOSIS — O10919 Unspecified pre-existing hypertension complicating pregnancy, unspecified trimester: Secondary | ICD-10-CM

## 2019-03-20 DIAGNOSIS — Z3689 Encounter for other specified antenatal screening: Secondary | ICD-10-CM | POA: Diagnosis not present

## 2019-03-20 DIAGNOSIS — K8000 Calculus of gallbladder with acute cholecystitis without obstruction: Secondary | ICD-10-CM | POA: Insufficient documentation

## 2019-03-20 DIAGNOSIS — O10913 Unspecified pre-existing hypertension complicating pregnancy, third trimester: Secondary | ICD-10-CM

## 2019-03-20 DIAGNOSIS — O163 Unspecified maternal hypertension, third trimester: Secondary | ICD-10-CM | POA: Insufficient documentation

## 2019-03-20 DIAGNOSIS — O26613 Liver and biliary tract disorders in pregnancy, third trimester: Secondary | ICD-10-CM | POA: Diagnosis not present

## 2019-03-20 DIAGNOSIS — R9341 Abnormal radiologic findings on diagnostic imaging of renal pelvis, ureter, or bladder: Secondary | ICD-10-CM

## 2019-03-20 DIAGNOSIS — Z3A39 39 weeks gestation of pregnancy: Secondary | ICD-10-CM

## 2019-03-20 HISTORY — DX: Calculus of gallbladder without cholecystitis without obstruction: K80.20

## 2019-03-20 NOTE — Discharge Instructions (Signed)
Third Trimester of Pregnancy The third trimester is from week 28 through week 40 (months 7 through 9). The third trimester is a time when the unborn baby (fetus) is growing rapidly. At the end of the ninth month, the fetus is about 20 inches in length and weighs 6-10 pounds. Body changes during your third trimester Your body will continue to go through many changes during pregnancy. The changes vary from woman to woman. During the third trimester:  Your weight will continue to increase. You can expect to gain 25-35 pounds (11-16 kg) by the end of the pregnancy.  You may begin to get stretch marks on your hips, abdomen, and breasts.  You may urinate more often because the fetus is moving lower into your pelvis and pressing on your bladder.  You may develop or continue to have heartburn. This is caused by increased hormones that slow down muscles in the digestive tract.  You may develop or continue to have constipation because increased hormones slow digestion and cause the muscles that push waste through your intestines to relax.  You may develop hemorrhoids. These are swollen veins (varicose veins) in the rectum that can itch or be painful.  You may develop swollen, bulging veins (varicose veins) in your legs.  You may have increased body aches in the pelvis, back, or thighs. This is due to weight gain and increased hormones that are relaxing your joints.  You may have changes in your hair. These can include thickening of your hair, rapid growth, and changes in texture. Some women also have hair loss during or after pregnancy, or hair that feels dry or thin. Your hair will most likely return to normal after your baby is born.  Your breasts will continue to grow and they will continue to become tender. A yellow fluid (colostrum) may leak from your breasts. This is the first milk you are producing for your baby.  Your belly button may stick out.  You may notice more swelling in your hands,  face, or ankles.  You may have increased tingling or numbness in your hands, arms, and legs. The skin on your belly may also feel numb.  You may feel short of breath because of your expanding uterus.  You may have more problems sleeping. This can be caused by the size of your belly, increased need to urinate, and an increase in your body's metabolism.  You may notice the fetus "dropping," or moving lower in your abdomen (lightening).  You may have increased vaginal discharge.  You may notice your joints feel loose and you may have pain around your pelvic bone. What to expect at prenatal visits You will have prenatal exams every 2 weeks until week 36. Then you will have weekly prenatal exams. During a routine prenatal visit:  You will be weighed to make sure you and the baby are growing normally.  Your blood pressure will be taken.  Your abdomen will be measured to track your baby's growth.  The fetal heartbeat will be listened to.  Any test results from the previous visit will be discussed.  You may have a cervical check near your due date to see if your cervix has softened or thinned (effaced).  You will be tested for Group B streptococcus. This happens between 35 and 37 weeks. Your health care provider may ask you:  What your birth plan is.  How you are feeling.  If you are feeling the baby move.  If you have had any abnormal   symptoms, such as leaking fluid, bleeding, severe headaches, or abdominal cramping.  If you are using any tobacco products, including cigarettes, chewing tobacco, and electronic cigarettes.  If you have any questions. Other tests or screenings that may be performed during your third trimester include:  Blood tests that check for low iron levels (anemia).  Fetal testing to check the health, activity level, and growth of the fetus. Testing is done if you have certain medical conditions or if there are problems during the pregnancy.  Nonstress test  (NST). This test checks the health of your baby to make sure there are no signs of problems, such as the baby not getting enough oxygen. During this test, a belt is placed around your belly. The baby is made to move, and its heart rate is monitored during movement. What is false labor? False labor is a condition in which you feel small, irregular tightenings of the muscles in the womb (contractions) that usually go away with rest, changing position, or drinking water. These are called Braxton Hicks contractions. Contractions may last for hours, days, or even weeks before true labor sets in. If contractions come at regular intervals, become more frequent, increase in intensity, or become painful, you should see your health care provider. What are the signs of labor?  Abdominal cramps.  Regular contractions that start at 10 minutes apart and become stronger and more frequent with time.  Contractions that start on the top of the uterus and spread down to the lower abdomen and back.  Increased pelvic pressure and dull back pain.  A watery or bloody mucus discharge that comes from the vagina.  Leaking of amniotic fluid. This is also known as your "water breaking." It could be a slow trickle or a gush. Let your health care provider know if it has a color or strange odor. If you have any of these signs, call your health care provider right away, even if it is before your due date. Follow these instructions at home: Medicines  Follow your health care provider's instructions regarding medicine use. Specific medicines may be either safe or unsafe to take during pregnancy.  Take a prenatal vitamin that contains at least 600 micrograms (mcg) of folic acid.  If you develop constipation, try taking a stool softener if your health care provider approves. Eating and drinking   Eat a balanced diet that includes fresh fruits and vegetables, whole grains, good sources of protein such as meat, eggs, or tofu,  and low-fat dairy. Your health care provider will help you determine the amount of weight gain that is right for you.  Avoid raw meat and uncooked cheese. These carry germs that can cause birth defects in the baby.  If you have low calcium intake from food, talk to your health care provider about whether you should take a daily calcium supplement.  Eat four or five small meals rather than three large meals a day.  Limit foods that are high in fat and processed sugars, such as fried and sweet foods.  To prevent constipation: ? Drink enough fluid to keep your urine clear or pale yellow. ? Eat foods that are high in fiber, such as fresh fruits and vegetables, whole grains, and beans. Activity  Exercise only as directed by your health care provider. Most women can continue their usual exercise routine during pregnancy. Try to exercise for 30 minutes at least 5 days a week. Stop exercising if you experience uterine contractions.  Avoid heavy lifting.  Do   not exercise in extreme heat or humidity, or at high altitudes.  Wear low-heel, comfortable shoes.  Practice good posture.  You may continue to have sex unless your health care provider tells you otherwise. Relieving pain and discomfort  Take frequent breaks and rest with your legs elevated if you have leg cramps or low back pain.  Take warm sitz baths to soothe any pain or discomfort caused by hemorrhoids. Use hemorrhoid cream if your health care provider approves.  Wear a good support bra to prevent discomfort from breast tenderness.  If you develop varicose veins: ? Wear support pantyhose or compression stockings as told by your healthcare provider. ? Elevate your feet for 15 minutes, 3-4 times a day. Prenatal care  Write down your questions. Take them to your prenatal visits.  Keep all your prenatal visits as told by your health care provider. This is important. Safety  Wear your seat belt at all times when driving.  Make  a list of emergency phone numbers, including numbers for family, friends, the hospital, and police and fire departments. General instructions  Avoid cat litter boxes and soil used by cats. These carry germs that can cause birth defects in the baby. If you have a cat, ask someone to clean the litter box for you.  Do not travel far distances unless it is absolutely necessary and only with the approval of your health care provider.  Do not use hot tubs, steam rooms, or saunas.  Do not drink alcohol.  Do not use any products that contain nicotine or tobacco, such as cigarettes and e-cigarettes. If you need help quitting, ask your health care provider.  Do not use any medicinal herbs or unprescribed drugs. These chemicals affect the formation and growth of the baby.  Do not douche or use tampons or scented sanitary pads.  Do not cross your legs for long periods of time.  To prepare for the arrival of your baby: ? Take prenatal classes to understand, practice, and ask questions about labor and delivery. ? Make a trial run to the hospital. ? Visit the hospital and tour the maternity area. ? Arrange for maternity or paternity leave through employers. ? Arrange for family and friends to take care of pets while you are in the hospital. ? Purchase a rear-facing car seat and make sure you know how to install it in your car. ? Pack your hospital bag. ? Prepare the baby's nursery. Make sure to remove all pillows and stuffed animals from the baby's crib to prevent suffocation.  Visit your dentist if you have not gone during your pregnancy. Use a soft toothbrush to brush your teeth and be gentle when you floss. Contact a health care provider if:  You are unsure if you are in labor or if your water has broken.  You become dizzy.  You have mild pelvic cramps, pelvic pressure, or nagging pain in your abdominal area.  You have lower back pain.  You have persistent nausea, vomiting, or diarrhea.   You have an unusual or bad smelling vaginal discharge.  You have pain when you urinate. Get help right away if:  Your water breaks before 37 weeks.  You have regular contractions less than 5 minutes apart before 37 weeks.  You have a fever.  You are leaking fluid from your vagina.  You have spotting or bleeding from your vagina.  You have severe abdominal pain or cramping.  You have rapid weight loss or weight gain.  You have   shortness of breath with chest pain.  You notice sudden or extreme swelling of your face, hands, ankles, feet, or legs.  Your baby makes fewer than 10 movements in 2 hours.  You have severe headaches that do not go away when you take medicine.  You have vision changes. Summary  The third trimester is from week 28 through week 40, months 7 through 9. The third trimester is a time when the unborn baby (fetus) is growing rapidly.  During the third trimester, your discomfort may increase as you and your baby continue to gain weight. You may have abdominal, leg, and back pain, sleeping problems, and an increased need to urinate.  During the third trimester your breasts will keep growing and they will continue to become tender. A yellow fluid (colostrum) may leak from your breasts. This is the first milk you are producing for your baby.  False labor is a condition in which you feel small, irregular tightenings of the muscles in the womb (contractions) that eventually go away. These are called Braxton Hicks contractions. Contractions may last for hours, days, or even weeks before true labor sets in.  Signs of labor can include: abdominal cramps; regular contractions that start at 10 minutes apart and become stronger and more frequent with time; watery or bloody mucus discharge that comes from the vagina; increased pelvic pressure and dull back pain; and leaking of amniotic fluid. This information is not intended to replace advice given to you by your health  care provider. Make sure you discuss any questions you have with your health care provider. Document Released: 03/05/2001 Document Revised: 07/02/2018 Document Reviewed: 04/16/2016 Elsevier Patient Education  2020 [employer]  

## 2019-03-20 NOTE — MAU Note (Signed)
Urine in lab 

## 2019-03-20 NOTE — MAU Note (Signed)
Pt is here for NST. Denies cntx, LOF, VB.+FM

## 2019-03-20 NOTE — MAU Provider Note (Signed)
First Provider Initiated Contact with Patient 03/20/19 1015      S: Ms. Tammy Mcmahon is a 38 y.o. [id number] at [redacted]w[redacted]d  who presents to MAU today for routine NST. She has twice weekly antenatal testing in the office and due to the holiday schedule she needed to be seen here today for NST. She denies vaginal bleeding. She denies LOF. She reports normal fetal movement.    O: BP 133/77 (BP Location: Right Arm)   Pulse 99   Temp 98.1 F (36.7 C) (Oral)   Resp 17   Ht 5\' 9"  (1.753 m)   Wt 124 kg   LMP 07/07/2018   SpO2 100% Comment: room air  BMI 40.37 kg/m  GENERAL: Well-developed, well-nourished female in no acute distress.  HEAD: Normocephalic, atraumatic.  CHEST: Normal effort of breathing, regular heart rate ABDOMEN: Soft, nontender, gravid  Cervical exam: NA, deferred     Fetal Monitoring: Baseline: 130 Variability: moderate Accelerations: 15x15 Decelerations: none Contractions: none    A: 1. NST (non-stress test) reactive   2. Renal pelvis enlarged on ultrasound   3. Calculus of gallbladder with acute cholecystitis without obstruction   4. Chronic hypertension in pregnancy   5. [redacted] weeks gestation of pregnancy      P: DC home 3rd Trimester precautions  PTL precautions  Fetal kick counts RX: none  Return to MAU as needed FU with OB as planned  Follow-up Information    [facility] Follow up.   Specialty: Obstetrics and Gynecology Contact information: [address] [place] DNP, CNM  03/20/19  10:22 AM

## 2019-03-22 ENCOUNTER — Other Ambulatory Visit: Payer: Self-pay

## 2019-03-22 ENCOUNTER — Other Ambulatory Visit (HOSPITAL_COMMUNITY)
Admission: RE | Admit: 2019-03-22 | Discharge: 2019-03-22 | Disposition: A | Discharge status: Home / Self Care | Payer: BC Managed Care – PPO | Source: Ambulatory Visit | Attending: Obstetrics & Gynecology | Admitting: Obstetrics & Gynecology

## 2019-03-22 ENCOUNTER — Ambulatory Visit (INDEPENDENT_AMBULATORY_CARE_PROVIDER_SITE_OTHER): Payer: BC Managed Care – PPO

## 2019-03-22 ENCOUNTER — Encounter: Payer: Self-pay | Admitting: Obstetrics & Gynecology

## 2019-03-22 ENCOUNTER — Ambulatory Visit (INDEPENDENT_AMBULATORY_CARE_PROVIDER_SITE_OTHER): Payer: BC Managed Care – PPO | Admitting: Obstetrics & Gynecology

## 2019-03-22 VITALS — BP 134/82 | HR 87 | Wt 275.0 lb

## 2019-03-22 DIAGNOSIS — O0990 Supervision of high risk pregnancy, unspecified, unspecified trimester: Secondary | ICD-10-CM | POA: Diagnosis present

## 2019-03-22 DIAGNOSIS — Z331 Pregnant state, incidental: Secondary | ICD-10-CM

## 2019-03-22 DIAGNOSIS — Z3A36 36 weeks gestation of pregnancy: Secondary | ICD-10-CM | POA: Diagnosis not present

## 2019-03-22 DIAGNOSIS — Z1389 Encounter for screening for other disorder: Secondary | ICD-10-CM

## 2019-03-22 DIAGNOSIS — Z362 Encounter for other antenatal screening follow-up: Secondary | ICD-10-CM | POA: Diagnosis not present

## 2019-03-22 DIAGNOSIS — O10919 Unspecified pre-existing hypertension complicating pregnancy, unspecified trimester: Secondary | ICD-10-CM | POA: Diagnosis not present

## 2019-03-22 DIAGNOSIS — O0993 Supervision of high risk pregnancy, unspecified, third trimester: Secondary | ICD-10-CM

## 2019-03-22 LAB — POCT URINALYSIS DIPSTICK OB
Blood, UA: NEGATIVE
Glucose, UA: NEGATIVE
Ketones, UA: NEGATIVE
Leukocytes, UA: NEGATIVE
Nitrite, UA: NEGATIVE
POC,PROTEIN,UA: NEGATIVE

## 2019-03-22 NOTE — Progress Notes (Signed)
[place] 12+2 wks,cephalic,posterior placenta gr 3,BPP 8/8,FHR 135 bpm,AFI 12 cm,mild right pyelectasis 8.7 mm,left renal pelvis wnl 4.6 mm,EFW 3425 g 86%,RI .51,.49,.44=14%

## 2019-03-22 NOTE — Progress Notes (Signed)
   HIGH-RISK PREGNANCY VISIT Patient name: Tammy Mcmahon MRN [id number]  Date of birth: 10/06/1980 Chief Complaint:   Routine Prenatal Visit  History of Present Illness:   Tammy Mcmahon is a 38 y.o. [id number] female at [redacted]w[redacted]d with an Estimated Date of Delivery: 04/13/19 being seen today for ongoing management of a high-risk pregnancy complicated by chronic hypertension currently on labetalol 100 BID.  Today she reports no complaints. Contractions: Not present. Vag. Bleeding: None.  Movement: Present. denies leaking of fluid.  Review of Systems:   Pertinent items are noted in HPI Denies abnormal vaginal discharge w/ itching/odor/irritation, headaches, visual changes, shortness of breath, chest pain, abdominal pain, severe nausea/vomiting, or problems with urination or bowel movements unless otherwise stated above. Pertinent History Reviewed:  Reviewed past medical,surgical, social, obstetrical and family history.  Reviewed problem list, medications and allergies. Physical Assessment:   Vitals:   03/22/19 1500  BP: 134/82  Pulse: 87  Weight: 275 lb (124.7 kg)  Body mass index is 40.61 kg/m.           Physical Examination:   General appearance: alert, well appearing, and in no distress  Mental status: alert, oriented to person, place, and time  Skin: warm & dry   Extremities:      Cardiovascular: normal heart rate noted  Respiratory: normal respiratory effort, no distress  Abdomen: gravid, soft, non-tender  Pelvic: Cervical exam deferred         Fetal Status:     Movement: Present    Fetal Surveillance Testing today: BPP 8/8   Chaperone: Rolena Infante    Results for orders placed or performed in visit on 03/22/19 (from the past 24 hour(s))  POC Urinalysis Dipstick OB   Collection Time: 03/22/19  3:02 PM  Result Value Ref Range   Color, UA     Clarity, UA     Glucose, UA Negative Negative   Bilirubin, UA     Ketones, UA n    Spec Grav, UA     Blood, UA n    pH, UA     POC,PROTEIN,UA Negative Negative, Trace, Small (1+), Moderate (2+), Large (3+), 4+   Urobilinogen, UA     Nitrite, UA n    Leukocytes, UA Negative Negative   Appearance     Odor      Assessment & Plan:  1) High-risk pregnancy [id number] at [redacted]w[redacted]d with an Estimated Date of Delivery: 04/13/19   2) CHTN, stable on labetalol 100 BID, BPP 8/8 with EFW close to 90%  3) AMA, stable  Meds: No orders of the defined types were placed in this encounter.   Labs/procedures today: BPP 8/8  Treatment Plan:  Twice weekly surveillance sonogram alternating with NST  Reviewed: term labor symptoms and general obstetric precautions including but not limited to vaginal bleeding, contractions, leaking of fluid and fetal movement were reviewed in detail with the patient.  All questions were answered.  home bp cuff. Rx faxed to . Check bp weekly, let us know if >140/90.   Follow-up: Return in about 3 days (around 03/25/2019) for NST, HROB.  Orders Placed This Encounter  Procedures  . Strep Gp B NAA  . POC Urinalysis Dipstick OB   Florian Buff  03/22/2019 3:37 PM

## 2019-03-23 ENCOUNTER — Encounter: Payer: BC Managed Care – PPO | Admitting: Obstetrics & Gynecology

## 2019-03-23 ENCOUNTER — Other Ambulatory Visit: Payer: BC Managed Care – PPO

## 2019-03-24 LAB — CERVICOVAGINAL ANCILLARY ONLY
Chlamydia: NEGATIVE
Comment: NEGATIVE
Comment: NORMAL
Neisseria Gonorrhea: NEGATIVE

## 2019-03-24 LAB — STREP GP B NAA: Strep Gp B NAA: NEGATIVE

## 2019-03-25 ENCOUNTER — Ambulatory Visit (INDEPENDENT_AMBULATORY_CARE_PROVIDER_SITE_OTHER): Payer: BC Managed Care – PPO | Admitting: *Deleted

## 2019-03-25 ENCOUNTER — Other Ambulatory Visit: Payer: Self-pay

## 2019-03-25 VITALS — BP 115/71 | HR 88 | Wt 275.2 lb

## 2019-03-25 DIAGNOSIS — R9341 Abnormal radiologic findings on diagnostic imaging of renal pelvis, ureter, or bladder: Secondary | ICD-10-CM

## 2019-03-25 DIAGNOSIS — Z3A37 37 weeks gestation of pregnancy: Secondary | ICD-10-CM

## 2019-03-25 DIAGNOSIS — Z331 Pregnant state, incidental: Secondary | ICD-10-CM

## 2019-03-25 DIAGNOSIS — O0990 Supervision of high risk pregnancy, unspecified, unspecified trimester: Secondary | ICD-10-CM | POA: Diagnosis not present

## 2019-03-25 DIAGNOSIS — Z1389 Encounter for screening for other disorder: Secondary | ICD-10-CM

## 2019-03-25 DIAGNOSIS — K8000 Calculus of gallbladder with acute cholecystitis without obstruction: Secondary | ICD-10-CM

## 2019-03-25 LAB — POCT URINALYSIS DIPSTICK OB
Blood, UA: NEGATIVE
Glucose, UA: NEGATIVE
Ketones, UA: NEGATIVE
Leukocytes, UA: NEGATIVE
Nitrite, UA: NEGATIVE

## 2019-03-25 NOTE — Progress Notes (Signed)
   NURSE VISIT- NST  SUBJECTIVE:  Tammy Mcmahon is a 38 y.o. [phone] female at [redacted]w[redacted]d, here for a NST for pregnancy complicated by [facility].  She reports active fetal movement, contractions: none, vaginal bleeding: none, membranes: intact.   OBJECTIVE:  BP 115/71   Pulse 88   Wt 275 lb 3.2 oz (124.8 kg)   LMP 07/07/2018   BMI 40.64 kg/m   Appears well, no apparent distress  Results for orders placed or performed in visit on 03/25/19 (from the past 24 hour(s))  POC Urinalysis Dipstick OB   Collection Time: 03/25/19 11:46 AM  Result Value Ref Range   Color, UA     Clarity, UA     Glucose, UA Negative Negative   Bilirubin, UA     Ketones, UA neg    Spec Grav, UA     Blood, UA neg    pH, UA     POC,PROTEIN,UA Trace Negative, Trace, Small (1+), Moderate (2+), Large (3+), 4+   Urobilinogen, UA     Nitrite, UA neg    Leukocytes, UA Negative Negative   Appearance     Odor      NST: FHR baseline 140 bpm, Variability: moderate, Accelerations:present, Decelerations:  Absent= Cat 1/Reactive Toco: none   ASSESSMENT: [id number] at [redacted]w[redacted]d with CHTN NST reactive  PLAN: EFM strip reviewed by Dr. Elonda Husky   Recommendations: keep next appointment as scheduled    Alice Rieger  03/25/2019 12:47 PM

## 2019-03-26 NOTE — L&D Delivery Note (Signed)
OB/[facility]N Faculty Practice Delivery Note  Tammy Mcmahon is a 39 y.o. [id number] s/p NSVD at [redacted]w[redacted]d. She was admitted for IOL for cHTN on meds. .   ROM: 5h 28m with clear fluid GBS Status: --Theda Sers (12/28 1600) Maximum Maternal Temperature: 98.3 F   Labor Progress: . Patient arrived at 4 cm dilation and was induced with pitocin and AROM and progressed to complete.   Delivery Date/Time: 04/06/2019 at 2234 Delivery: Called to room and patient was complete and pushing. Head delivered in ROP position. Nuchal x1 easily reduced. Shoulder and body delivered in usual fashion. Infant with spontaneous cry, placed on mother's abdomen, dried and stimulated. Cord clamped x 2 after 1-minute delay, and cut by FOB. Cord blood drawn. Placenta delivered spontaneously with gentle cord traction. Fundus firm with massage and Pitocin. Labia, perineum, vagina, and cervix inspected with a large periuretheral laceration that was reparied, bilateral labial lacerations of which only the R side was repaired, and a 1st degree perineal laceration that was hemostatic and not repaired.  Placenta: 3v intact, to L&D Complications: none Lacerations: large periuretheral laceration that was reparied with 4-0 monocryl, bilateral labial lacerations of which only the R side was repaired with 4-0 monocryl, and a 1st degree perineal laceration that was hemostatic and not repaired EBL: 247 cc Analgesia: epidural   Infant: APGAR (1 MIN): 9   APGAR (5 MINS): 9    Weight: 3379 grams  Tammy Seal, MD/[facility] OB/GYN Fellow, Faculty Practice

## 2019-03-29 ENCOUNTER — Other Ambulatory Visit: Payer: Self-pay | Admitting: Obstetrics & Gynecology

## 2019-03-29 DIAGNOSIS — O10919 Unspecified pre-existing hypertension complicating pregnancy, unspecified trimester: Secondary | ICD-10-CM

## 2019-03-30 ENCOUNTER — Ambulatory Visit (INDEPENDENT_AMBULATORY_CARE_PROVIDER_SITE_OTHER): Payer: BC Managed Care – PPO | Admitting: Obstetrics & Gynecology

## 2019-03-30 ENCOUNTER — Other Ambulatory Visit: Payer: Self-pay

## 2019-03-30 ENCOUNTER — Ambulatory Visit (INDEPENDENT_AMBULATORY_CARE_PROVIDER_SITE_OTHER): Payer: BC Managed Care – PPO

## 2019-03-30 ENCOUNTER — Encounter: Payer: Self-pay | Admitting: Obstetrics & Gynecology

## 2019-03-30 VITALS — BP 154/97 | HR 93 | Wt 275.0 lb

## 2019-03-30 DIAGNOSIS — O10913 Unspecified pre-existing hypertension complicating pregnancy, third trimester: Secondary | ICD-10-CM | POA: Diagnosis not present

## 2019-03-30 DIAGNOSIS — I10 Essential (primary) hypertension: Secondary | ICD-10-CM

## 2019-03-30 DIAGNOSIS — R9341 Abnormal radiologic findings on diagnostic imaging of renal pelvis, ureter, or bladder: Secondary | ICD-10-CM

## 2019-03-30 DIAGNOSIS — Z1389 Encounter for screening for other disorder: Secondary | ICD-10-CM

## 2019-03-30 DIAGNOSIS — Z331 Pregnant state, incidental: Secondary | ICD-10-CM

## 2019-03-30 DIAGNOSIS — O10919 Unspecified pre-existing hypertension complicating pregnancy, unspecified trimester: Secondary | ICD-10-CM

## 2019-03-30 DIAGNOSIS — Z3A38 38 weeks gestation of pregnancy: Secondary | ICD-10-CM | POA: Diagnosis not present

## 2019-03-30 DIAGNOSIS — O10013 Pre-existing essential hypertension complicating pregnancy, third trimester: Secondary | ICD-10-CM

## 2019-03-30 LAB — POCT URINALYSIS DIPSTICK OB
Blood, UA: NEGATIVE
Glucose, UA: NEGATIVE
Ketones, UA: NEGATIVE
Leukocytes, UA: NEGATIVE
Nitrite, UA: NEGATIVE

## 2019-03-30 NOTE — Progress Notes (Signed)
   HIGH-RISK PREGNANCY VISIT Patient name: Tammy Mcmahon MRN [id number]  Date of birth: [birth year] Chief Complaint:   Routine Prenatal Visit  History of Present Illness:   Tammy Mcmahon is a 39 y.o. [id number] female at [redacted]w[redacted]d with an Estimated Date of Delivery: 04/13/19 being seen today for ongoing management of a high-risk pregnancy complicated by chronic hypertension currently on labetalol 100 mg BID.  Today she reports just beginning to gfeel bad. Contractions: Not present. Vag. Bleeding: None.  Movement: Present. denies leaking of fluid.  Review of Systems:   Pertinent items are noted in HPI Denies abnormal vaginal discharge w/ itching/odor/irritation, headaches, visual changes, shortness of breath, chest pain, abdominal pain, severe nausea/vomiting, or problems with urination or bowel movements unless otherwise stated above. Pertinent History Reviewed:  Reviewed past medical,surgical, social, obstetrical and family history.  Reviewed problem list, medications and allergies. Physical Assessment:   Vitals:   03/30/19 1621 03/30/19 1630  BP: (!) 150/90 (!) 154/97  Pulse: 92 93  Weight: 275 lb (124.7 kg)   Body mass index is 40.61 kg/m.           Physical Examination:   General appearance: alert, well appearing, and in no distress  Mental status: alert, oriented to person, place, and time  Skin: warm & dry   Extremities: Edema: Trace    Cardiovascular: normal heart rate noted  Respiratory: normal respiratory effort, no distress  Abdomen: gravid, soft, non-tender  Pelvic: Cervical exam deferred         Fetal Status:     Movement: Present    Fetal Surveillance Testing today: BPP 8/8 with Dopplers normal 25%   Chaperone: n/a    Results for orders placed or performed in visit on 03/30/19 (from the past 24 hour(s))  POC Urinalysis Dipstick OB   Collection Time: 03/30/19  4:28 PM  Result Value Ref Range   Color, UA     Clarity, UA     Glucose, UA Negative Negative   Bilirubin, UA      Ketones, UA neg    Spec Grav, UA     Blood, UA neg    pH, UA     POC,PROTEIN,UA Small (1+) Negative, Trace, Small (1+), Moderate (2+), Large (3+), 4+   Urobilinogen, UA     Nitrite, UA neg    Leukocytes, UA Negative Negative   Appearance     Odor      Assessment & Plan:  1) High-risk pregnancy [id number] at [redacted]w[redacted]d with an Estimated Date of Delivery: 04/13/19   2) CHTN, stable, 1+ protein today will check Pr/cr    Meds: No orders of the defined types were placed in this encounter.   Labs/procedures today: sonogram  Treatment Plan:  IOL 04/06/2019  Reviewed: Term labor symptoms and general obstetric precautions including but not limited to vaginal bleeding, contractions, leaking of fluid and fetal movement were reviewed in detail with the patient.  All questions were answered. Has home bp cuff. Rx faxed to . Check bp weekly, let us know if >140/90.   Follow-up: Return in about 3 days (around 04/02/2019) for NST, HROB.  Orders Placed This Encounter  Procedures  . Protein / creatinine ratio, urine  . POC Urinalysis Dipstick OB   Lazaro Arms  03/30/2019 4:41 PM

## 2019-03-30 NOTE — Progress Notes (Signed)
[place] 38 wks,cephalic,BPP 8/8,FHR 140 bpm,RI .52,.48,.50,.51=25%,posterior placenta gr 3,afi 11 cm

## 2019-03-31 ENCOUNTER — Other Ambulatory Visit: Payer: Self-pay | Admitting: Advanced Practice Midwife

## 2019-03-31 LAB — PROTEIN / CREATININE RATIO, URINE
Creatinine, Urine: 296.1 mg/dL
Protein, Ur: 26.7 mg/dL
Protein/Creat Ratio: 90 mg/g creat (ref 0–200)

## 2019-04-01 ENCOUNTER — Other Ambulatory Visit (HOSPITAL_COMMUNITY): Payer: Self-pay | Admitting: Advanced Practice Midwife

## 2019-04-02 ENCOUNTER — Other Ambulatory Visit: Payer: Self-pay

## 2019-04-02 ENCOUNTER — Ambulatory Visit (INDEPENDENT_AMBULATORY_CARE_PROVIDER_SITE_OTHER): Payer: BC Managed Care – PPO | Admitting: *Deleted

## 2019-04-02 VITALS — BP 135/82 | HR 102

## 2019-04-02 DIAGNOSIS — Z1389 Encounter for screening for other disorder: Secondary | ICD-10-CM | POA: Diagnosis not present

## 2019-04-02 DIAGNOSIS — Z331 Pregnant state, incidental: Secondary | ICD-10-CM

## 2019-04-02 DIAGNOSIS — Z3A38 38 weeks gestation of pregnancy: Secondary | ICD-10-CM

## 2019-04-02 DIAGNOSIS — O0990 Supervision of high risk pregnancy, unspecified, unspecified trimester: Secondary | ICD-10-CM

## 2019-04-02 DIAGNOSIS — O10919 Unspecified pre-existing hypertension complicating pregnancy, unspecified trimester: Secondary | ICD-10-CM

## 2019-04-02 DIAGNOSIS — O0993 Supervision of high risk pregnancy, unspecified, third trimester: Secondary | ICD-10-CM

## 2019-04-02 LAB — POCT URINALYSIS DIPSTICK OB
Blood, UA: NEGATIVE
Glucose, UA: NEGATIVE
Ketones, UA: NEGATIVE
Nitrite, UA: NEGATIVE

## 2019-04-02 NOTE — Treatment Plan (Signed)
   Induction Assessment Scheduling Form: Fax to [facility]:  [phone]  Carrigan Delafuente                                                                                   DOB:  July 04, 1980                                                            MRN:  [id number]                                                                     Phone #:  [phone]                          Provider:  [facility]  GP:  [id number]                                                            Estimated Date of Delivery: 04/13/19  Dating Criteria: 1st trimester sonogram, LMP    Medical Indications for induction:  Chronic hypertension Admission Date/Time:  04/06/19 AM Gestational age on admission:  [redacted]w[redacted]d   There were no vitals filed for this visit. HIV:  Non Reactive (10/16 0843) GBS: --Theda Sers (12/28 1600)  4 cm dilated, 50 effaced, -2 station   Method of induction(proposed):  pitocin   Scheduling Provider Signature:  Lazaro Arms, MD                                            Today's Date:  04/02/2019

## 2019-04-02 NOTE — Progress Notes (Signed)
   NURSE VISIT- NST  SUBJECTIVE:  Tammy Mcmahon is a 39 y.o. [phone] female at [redacted]w[redacted]d, here for a NST for pregnancy complicated by [facility].  She reports active fetal movement, contractions: irregular, vaginal bleeding: none, membranes: intact.   OBJECTIVE:  BP 135/82   Pulse (!) 102   LMP 07/07/2018   Appears well, no apparent distress  Results for orders placed or performed in visit on 04/02/19 (from the past 24 hour(s))  POC Urinalysis Dipstick OB   Collection Time: 04/02/19 11:05 AM  Result Value Ref Range   Color, UA     Clarity, UA     Glucose, UA Negative Negative   Bilirubin, UA     Ketones, UA neg    Spec Grav, UA     Blood, UA neg    pH, UA     POC,PROTEIN,UA Trace Negative, Trace, Small (1+), Moderate (2+), Large (3+), 4+   Urobilinogen, UA     Nitrite, UA neg    Leukocytes, UA Trace (A) Negative   Appearance     Odor      NST: FHR baseline 140 bpm, Variability: moderate, Accelerations:present, Decelerations:  Absent= Cat 1/Reactive Toco: none   ASSESSMENT: [id number] at [redacted]w[redacted]d with CHTN NST reactive  PLAN: EFM strip reviewed by Dr. Despina Hidden   Recommendations: keep next appointment as scheduled    Cephas Revard, Faith Rogue  04/02/2019 11:09 AM

## 2019-04-05 ENCOUNTER — Other Ambulatory Visit: Payer: Self-pay | Admitting: Advanced Practice Midwife

## 2019-04-05 ENCOUNTER — Telehealth: Payer: Self-pay | Admitting: *Deleted

## 2019-04-05 ENCOUNTER — Encounter: Payer: Self-pay | Admitting: *Deleted

## 2019-04-05 ENCOUNTER — Other Ambulatory Visit: Payer: Self-pay | Admitting: *Deleted

## 2019-04-05 ENCOUNTER — Other Ambulatory Visit (HOSPITAL_COMMUNITY)
Admission: RE | Admit: 2019-04-05 | Discharge: 2019-04-05 | Disposition: A | Discharge status: Home / Self Care | Payer: BC Managed Care – PPO | Source: Ambulatory Visit | Attending: Obstetrics & Gynecology | Admitting: Obstetrics & Gynecology

## 2019-04-05 ENCOUNTER — Other Ambulatory Visit: Payer: Self-pay

## 2019-04-05 DIAGNOSIS — U071 COVID-19: Secondary | ICD-10-CM | POA: Insufficient documentation

## 2019-04-05 DIAGNOSIS — Z01812 Encounter for preprocedural laboratory examination: Secondary | ICD-10-CM | POA: Insufficient documentation

## 2019-04-05 LAB — SARS CORONAVIRUS 2 (TAT 6-24 HRS): SARS Coronavirus 2: POSITIVE — AB

## 2019-04-05 MED ORDER — LABETALOL HCL 100 MG PO TABS: 100.0000 mg | ORAL_TABLET | Freq: Two times a day (BID) | ORAL | 3 refills | Status: DC

## 2019-04-05 NOTE — Telephone Encounter (Signed)
Pt is scheduled for an induction tomorrow and has some questions to ask. Please call or send message through MyChart. Thanks!!

## 2019-04-05 NOTE — Progress Notes (Signed)
done

## 2019-04-06 ENCOUNTER — Inpatient Hospital Stay (HOSPITAL_COMMUNITY): Payer: BC Managed Care – PPO | Admitting: Anesthesiology

## 2019-04-06 ENCOUNTER — Inpatient Hospital Stay (HOSPITAL_COMMUNITY)
Admission: AD | Admit: 2019-04-06 | Discharge: 2019-04-08 | DRG: 805 | Disposition: A | Discharge status: Home / Self Care | Payer: BC Managed Care – PPO | Attending: Family Medicine | Admitting: Family Medicine

## 2019-04-06 ENCOUNTER — Encounter: Payer: BC Managed Care – PPO | Admitting: Obstetrics & Gynecology

## 2019-04-06 ENCOUNTER — Inpatient Hospital Stay (HOSPITAL_COMMUNITY): Payer: BC Managed Care – PPO

## 2019-04-06 ENCOUNTER — Other Ambulatory Visit: Payer: Self-pay

## 2019-04-06 ENCOUNTER — Encounter: Payer: Self-pay | Admitting: Medical

## 2019-04-06 ENCOUNTER — Encounter (HOSPITAL_COMMUNITY): Payer: Self-pay | Admitting: Obstetrics & Gynecology

## 2019-04-06 ENCOUNTER — Other Ambulatory Visit: Payer: BC Managed Care – PPO

## 2019-04-06 DIAGNOSIS — O99214 Obesity complicating childbirth: Secondary | ICD-10-CM | POA: Diagnosis present

## 2019-04-06 DIAGNOSIS — Z6791 Unspecified blood type, Rh negative: Secondary | ICD-10-CM

## 2019-04-06 DIAGNOSIS — Z3A39 39 weeks gestation of pregnancy: Secondary | ICD-10-CM | POA: Diagnosis not present

## 2019-04-06 DIAGNOSIS — Z01812 Encounter for preprocedural laboratory examination: Secondary | ICD-10-CM | POA: Diagnosis not present

## 2019-04-06 DIAGNOSIS — K8020 Calculus of gallbladder without cholecystitis without obstruction: Secondary | ICD-10-CM | POA: Diagnosis present

## 2019-04-06 DIAGNOSIS — O9962 Diseases of the digestive system complicating childbirth: Secondary | ICD-10-CM | POA: Diagnosis present

## 2019-04-06 DIAGNOSIS — O1002 Pre-existing essential hypertension complicating childbirth: Principal | ICD-10-CM | POA: Diagnosis present

## 2019-04-06 DIAGNOSIS — O9852 Other viral diseases complicating childbirth: Secondary | ICD-10-CM | POA: Diagnosis present

## 2019-04-06 DIAGNOSIS — O0990 Supervision of high risk pregnancy, unspecified, unspecified trimester: Secondary | ICD-10-CM

## 2019-04-06 DIAGNOSIS — D649 Anemia, unspecified: Secondary | ICD-10-CM | POA: Diagnosis present

## 2019-04-06 DIAGNOSIS — O26893 Other specified pregnancy related conditions, third trimester: Secondary | ICD-10-CM | POA: Diagnosis present

## 2019-04-06 DIAGNOSIS — U071 COVID-19: Secondary | ICD-10-CM | POA: Diagnosis present

## 2019-04-06 DIAGNOSIS — O358XX0 Maternal care for other (suspected) fetal abnormality and damage, not applicable or unspecified: Secondary | ICD-10-CM | POA: Diagnosis present

## 2019-04-06 DIAGNOSIS — Z7982 Long term (current) use of aspirin: Secondary | ICD-10-CM

## 2019-04-06 DIAGNOSIS — O360130 Maternal care for anti-D [Rh] antibodies, third trimester, not applicable or unspecified: Secondary | ICD-10-CM | POA: Diagnosis not present

## 2019-04-06 DIAGNOSIS — I10 Essential (primary) hypertension: Secondary | ICD-10-CM | POA: Diagnosis present

## 2019-04-06 DIAGNOSIS — O10919 Unspecified pre-existing hypertension complicating pregnancy, unspecified trimester: Secondary | ICD-10-CM | POA: Diagnosis present

## 2019-04-06 DIAGNOSIS — O26899 Other specified pregnancy related conditions, unspecified trimester: Secondary | ICD-10-CM

## 2019-04-06 LAB — CBC
HCT: 32.7 % — ABNORMAL LOW (ref 36.0–46.0)
Hemoglobin: 9.9 g/dL — ABNORMAL LOW (ref 12.0–15.0)
MCH: 25.6 pg — ABNORMAL LOW (ref 26.0–34.0)
MCHC: 30.3 g/dL (ref 30.0–36.0)
MCV: 84.7 fL (ref 80.0–100.0)
Platelets: 194 10*3/uL (ref 150–400)
RBC: 3.86 MIL/uL — ABNORMAL LOW (ref 3.87–5.11)
RDW: 13.7 % (ref 11.5–15.5)
WBC: 6.3 10*3/uL (ref 4.0–10.5)
nRBC: 0 % (ref 0.0–0.2)

## 2019-04-06 LAB — COMPREHENSIVE METABOLIC PANEL
ALT: 6 U/L (ref 0–44)
AST: 14 U/L — ABNORMAL LOW (ref 15–41)
Albumin: 2.8 g/dL — ABNORMAL LOW (ref 3.5–5.0)
Alkaline Phosphatase: 119 U/L (ref 38–126)
Anion gap: 8 (ref 5–15)
BUN: 6 mg/dL (ref 6–20)
CO2: 20 mmol/L — ABNORMAL LOW (ref 22–32)
Calcium: 8.3 mg/dL — ABNORMAL LOW (ref 8.9–10.3)
Chloride: 108 mmol/L (ref 98–111)
Creatinine, Ser: 0.59 mg/dL (ref 0.44–1.00)
GFR calc Af Amer: >60 mL/min (ref >60)
GFR calc non Af Amer: >60 mL/min (ref >60)
Glucose, Bld: 84 mg/dL (ref 70–99)
Potassium: 3.5 mmol/L (ref 3.5–5.1)
Sodium: 136 mmol/L (ref 135–145)
Total Bilirubin: 0.8 mg/dL (ref 0.3–1.2)
Total Protein: 6.3 g/dL — ABNORMAL LOW (ref 6.5–8.1)

## 2019-04-06 LAB — PROTEIN / CREATININE RATIO, URINE
Creatinine, Urine: 99.61 mg/dL
Protein Creatinine Ratio: 0.13 mg/mg{Cre} (ref 0.00–0.15)
Total Protein, Urine: 13 mg/dL

## 2019-04-06 MED ORDER — EPHEDRINE 5 MG/ML INJ: 10.0000 mg | INTRAVENOUS | Status: DC | PRN

## 2019-04-06 MED ORDER — LACTATED RINGERS IV SOLN
500.0000 mL | INTRAVENOUS | Status: DC | PRN
  Administered 2019-04-06 (×4): 500 mL via INTRAVENOUS

## 2019-04-06 MED ORDER — ONDANSETRON HCL 4 MG/2ML IJ SOLN: 4.0000 mg | Freq: Four times a day (QID) | INTRAMUSCULAR | Status: DC | PRN

## 2019-04-06 MED ORDER — OXYCODONE-ACETAMINOPHEN 5-325 MG PO TABS: 2.0000 | ORAL_TABLET | ORAL | Status: DC | PRN

## 2019-04-06 MED ORDER — LACTATED RINGERS IV SOLN
INTRAVENOUS | Status: DC
  Administered 2019-04-06: 11:00:00 125 mL/h via INTRAVENOUS

## 2019-04-06 MED ORDER — DIPHENHYDRAMINE HCL 50 MG/ML IJ SOLN: 12.5000 mg | INTRAMUSCULAR | Status: DC | PRN

## 2019-04-06 MED ORDER — LIDOCAINE HCL (PF) 1 % IJ SOLN
30.0000 mL | INTRAMUSCULAR | Status: AC | PRN
  Administered 2019-04-06: 23:00:00 30 mL via SUBCUTANEOUS
  Filled 2019-04-06: qty 30

## 2019-04-06 MED ORDER — SODIUM BICARBONATE 8.4 % IV SOLN
INTRAVENOUS | Status: DC | PRN
  Administered 2019-04-06 (×2): 2 mL via EPIDURAL

## 2019-04-06 MED ORDER — OXYTOCIN BOLUS FROM INFUSION
500.0000 mL | Freq: Once | INTRAVENOUS | Status: AC
  Administered 2019-04-06: 500 mL via INTRAVENOUS

## 2019-04-06 MED ORDER — SOD CITRATE-CITRIC ACID 500-334 MG/5ML PO SOLN: 30.0000 mL | ORAL | Status: DC | PRN

## 2019-04-06 MED ORDER — PANTOPRAZOLE SODIUM 40 MG PO TBEC
40.0000 mg | DELAYED_RELEASE_TABLET | Freq: Every day | ORAL | Status: DC
  Administered 2019-04-06: 15:00:00 40 mg via ORAL
  Filled 2019-04-06: qty 1

## 2019-04-06 MED ORDER — SODIUM CHLORIDE (PF) 0.9 % IJ SOLN
INTRAMUSCULAR | Status: DC | PRN
  Administered 2019-04-06: 12 mL/h via EPIDURAL

## 2019-04-06 MED ORDER — LIDOCAINE-EPINEPHRINE (PF) 2 %-1:200000 IJ SOLN: INTRAMUSCULAR | Status: DC | PRN

## 2019-04-06 MED ORDER — OXYTOCIN 40 UNITS IN NORMAL SALINE INFUSION - SIMPLE MED
2.5000 [IU]/h | INTRAVENOUS | Status: DC
  Administered 2019-04-07: 01:00:00 2.5 [IU]/h via INTRAVENOUS

## 2019-04-06 MED ORDER — LACTATED RINGERS IV SOLN: 500.0000 mL | Freq: Once | INTRAVENOUS | Status: DC

## 2019-04-06 MED ORDER — PHENYLEPHRINE 40 MCG/ML (10ML) SYRINGE FOR IV PUSH (FOR BLOOD PRESSURE SUPPORT)
80.0000 ug | PREFILLED_SYRINGE | INTRAVENOUS | Status: AC | PRN
  Administered 2019-04-06 (×3): 80 ug via INTRAVENOUS

## 2019-04-06 MED ORDER — ACETAMINOPHEN 325 MG PO TABS
650.0000 mg | ORAL_TABLET | ORAL | Status: DC | PRN
  Administered 2019-04-06: 17:00:00 650 mg via ORAL
  Filled 2019-04-06: qty 2

## 2019-04-06 MED ORDER — OXYTOCIN 40 UNITS IN NORMAL SALINE INFUSION - SIMPLE MED
1.0000 m[IU]/min | INTRAVENOUS | Status: DC
  Administered 2019-04-06: 13:00:00 2 m[IU]/min via INTRAVENOUS
  Administered 2019-04-06: 21:00:00 6 m[IU]/min via INTRAVENOUS
  Filled 2019-04-06: qty 1000

## 2019-04-06 MED ORDER — PHENYLEPHRINE 40 MCG/ML (10ML) SYRINGE FOR IV PUSH (FOR BLOOD PRESSURE SUPPORT)
80.0000 ug | PREFILLED_SYRINGE | INTRAVENOUS | Status: DC | PRN
  Filled 2019-04-06: qty 10

## 2019-04-06 MED ORDER — AMMONIA AROMATIC IN INHA
RESPIRATORY_TRACT | Status: AC
  Filled 2019-04-06: qty 10

## 2019-04-06 MED ORDER — FENTANYL-BUPIVACAINE-NACL 0.5-0.125-0.9 MG/250ML-% EP SOLN
12.0000 mL/h | EPIDURAL | Status: DC | PRN
  Filled 2019-04-06: qty 250

## 2019-04-06 MED ORDER — OXYCODONE-ACETAMINOPHEN 5-325 MG PO TABS: 1.0000 | ORAL_TABLET | ORAL | Status: DC | PRN

## 2019-04-06 MED ORDER — TERBUTALINE SULFATE 1 MG/ML IJ SOLN: 0.2500 mg | Freq: Once | INTRAMUSCULAR | Status: DC | PRN

## 2019-04-06 NOTE — Anesthesia Procedure Notes (Addendum)
Epidural Patient location during procedure: OB Start time: 04/06/2019 5:45 PM End time: 04/06/2019 6:05 PM  Staffing Anesthesiologist: Elmer Picker, MD Performed: anesthesiologist   Preanesthetic Checklist Completed: patient identified, IV checked, risks and benefits discussed, monitors and equipment checked, pre-op evaluation and timeout performed  Epidural Patient position: sitting Prep: DuraPrep and site prepped and draped Patient monitoring: heart rate, continuous pulse ox and blood pressure Approach: midline Location: L3-L4 Injection technique: LOR air  Needle:  Needle type: Tuohy  Needle gauge: 17 G Needle length: 9 cm Needle insertion depth: 6 cm Catheter type: closed end flexible Catheter size: 19 Gauge Catheter at skin depth: 12 cm Test dose: negative  Assessment Sensory level: T8 Events: blood not aspirated, injection not painful, no injection resistance, no paresthesia and negative IV test  Additional Notes Patient identified. Risks/Benefits/Options discussed with patient including but not limited to bleeding, infection, nerve damage, paralysis, failed block, incomplete pain control, headache, blood pressure changes, nausea, vomiting, reactions to medication both or allergic, itching and postpartum back pain. Confirmed with bedside nurse the patient's most recent platelet count. Confirmed with patient that they are not currently taking any anticoagulation, have any bleeding history or any family history of bleeding disorders. Patient expressed understanding and wished to proceed. All questions were answered. Sterile technique was used throughout the entire procedure. Please see nursing notes for vital signs. Test dose was given through epidural catheter and negative prior to continuing to dose epidural or start infusion. Warning signs of high block given to the patient including shortness of breath, tingling/numbness in hands, complete motor block, or any concerning  symptoms with instructions to call for help. Patient was given instructions on fall risk and not to get out of bed. All questions and concerns addressed with instructions to call with any issues or inadequate analgesia.  Reason for block:procedure for pain

## 2019-04-06 NOTE — H&P (Addendum)
OBSTETRIC ADMISSION HISTORY AND PHYSICAL  Tammy Mcmahon is a 39 y.o. female [phone] with IUP at [redacted]w[redacted]d by LMP c/w 7wk [place] presenting for IOL d/t cHTN, well controlled. She reports +FMs, No LOF, no VB, no blurry vision, headaches or peripheral edema, and RUQ pain.  She plans on breast feeding. Her partner plans to have a vasectomy for birth control. She received her prenatal care at [facility]   Dating: By LMP c/w 7w [place] --->  Estimated Date of Delivery: 04/13/19  Sono:    @[redacted]w[redacted]d , CWD, normal anatomy, cephalic presentation, posterior placental lie @[redacted]w[redacted]d , CWD, mild dilation of left RP 26mm, cephalic presentation, posterior placental lie @[redacted]w[redacted]d , CWD, normal anatomy, cephalic presentation, posterior placental lie, 256g, 38% EFW  Prenatal History/Complications: cHTN, good control, on ASA 162mg  and labetalol 100mg  BID COVID-19 (+) 04/05/2019 Cholelithiasis Rh- negative Fetal L renal pyelectasis on [place]  Past Medical History: Past Medical History:  Diagnosis Date   Gall stones    Hypertension     Past Surgical History: Past Surgical History:  Procedure Laterality Date   NO PAST SURGERIES      Obstetrical History: OB History     Gravida  4   Para  2   Term  2   Preterm      AB  1   Living  2      SAB  1   TAB      Ectopic      Multiple      Live Births  2           Social History: Social History   Socioeconomic History   Marital status: Married    Spouse name: bj Cancio   Number of children: 2   Years of education: Not on file   Highest education level: Associate degree: occupational, Hotel manager, or vocational program  Occupational History   Not on file  Tobacco Use   Smoking status: Never Smoker   Smokeless tobacco: Never Used  Substance and Sexual Activity   Alcohol use: Never   Drug use: Never   Sexual activity: Yes    Birth control/protection: None  Other Topics Concern   Not on file  Social History Narrative   Not on file   Social  Determinants of Health   Financial Resource Strain: Low Risk    Difficulty of Paying Living Expenses: Not hard at all  Food Insecurity: No Food Insecurity   Worried About Charity fundraiser in the Last Year: Never true   Ran Out of Food in the Last Year: Never true  Transportation Needs: No Transportation Needs   Lack of Transportation (Medical): No   Lack of Transportation (Non-Medical): No  Physical Activity: Inactive   Days of Exercise per Week: 0 days   Minutes of Exercise per Session: 0 min  Stress: No Stress Concern Present   Feeling of Stress : Only a little  Social Connections: Slightly Isolated   Frequency of Communication with Friends and Family: More than three times a week   Frequency of Social Gatherings with Friends and Family: Once a week   Attends Religious Services: Never   Marine scientist or Organizations: Yes   Attends Archivist Meetings: Never   Marital Status: Married    Family History: Family History  Problem Relation Age of Onset   Diabetes Father     Allergies: Allergies  Allergen Reactions   Fish Oil     Medications Prior to Admission  Medication  Sig Dispense Refill Last Dose   ferrous sulfate 325 (65 FE) MG tablet Take 1 tablet (325 mg total) by mouth 2 (two) times daily with a meal. 60 tablet 3    fluticasone (FLONASE) 50 MCG/ACT nasal spray Place 1 spray into both nostrils as needed for allergies or rhinitis.      GNP ASPIRIN LOW DOSE 81 MG EC tablet       labetalol (NORMODYNE) 100 MG tablet Take 1 tablet (100 mg total) by mouth 2 (two) times daily. 60 tablet 3    omeprazole (PRILOSEC) 20 MG capsule Take 1 capsule (20 mg total) by mouth 2 (two) times daily before a meal. 60 capsule 6    ondansetron (ZOFRAN) 8 MG tablet Take 1 tablet (8 mg total) by mouth every 8 (eight) hours as needed for nausea or vomiting. 20 tablet 1    promethazine (PHENERGAN) 25 MG tablet Take 0.5-1 tablets (12.5-25 mg total) by mouth every 6 (six) hours  as needed for nausea or vomiting. (Patient not taking: Reported on 03/25/2019) 30 tablet 0      Review of Systems   All systems reviewed and negative except as stated in HPI  Blood pressure (!) 157/85, pulse (!) 105, temperature 98.2 F (36.8 C), temperature source Oral, resp. rate 18, height 5\' 10"  (1.778 m), weight 124.7 kg, last menstrual period 07/07/2018. General appearance: alert, cooperative, appears stated age, no distress and moderately obese Lungs: normal effort Heart: regular rate  Abdomen: soft, non-tender; bowel sounds normal Pelvic: gravid uterus GU: No vaginal lesions  Extremities: Homans sign is negative, no sign of DVT DTR's intact Presentation: unsure and plan to check during cervical exam Fetal monitoringBaseline: 150 bpm, Variability: Good {> 6 bpm) and Accelerations: Reactive Uterine activity: None     Prenatal labs: ABO, Rh: AB/Negative/-- (06/23 0000) Antibody: Positive, See Final Results (10/16 0843) Rubella: Immune (06/23 0000) RPR: Non Reactive (10/16 0843)  HBsAg: Negative (06/23 0000)  HIV: Non Reactive (10/16 0843)  GBS: --09-23-2002 (12/28 1600)  2 hr Glucola 128 Genetic screening  Declined Anatomy 05-08-1976 Normal female, mild Left RPD 53mm  Prenatal Transfer Tool  Maternal Diabetes: No Genetic Screening: Declined Maternal Ultrasounds/Referrals: Fetal Kidney Anomalies Fetal Ultrasounds or other Referrals:  Referred to Materal Fetal Medicine  Maternal Substance Abuse:  No Significant Maternal Medications:  Meds include: Other: Labetalol, ASA Significant Maternal Lab Results: None  No results found for this or any previous visit (from the past 24 hour(s)).  Patient Active Problem List   Diagnosis Date Noted   COVID-19 04/06/2019   Chronic hypertension during pregnancy 04/06/2019   Renal pelvis enlarged on ultrasound 02/25/2019   Cholelithiasis 12/13/2018   Supervision of high risk pregnancy, antepartum 11/16/2018   Chronic hypertension affecting  pregnancy 11/16/2018   Rh negative state in antepartum period 11/16/2018    Assessment/Plan:  Tammy Mcmahon is a 39 y.o. 31 at [redacted]w[redacted]d here for IOL d/t cHTN, well controlled.  #cHTN Patient well controlled on ASA 162 mg daily, labetalol 100 mg BID. Baseline PIH labs: CMP WNL, P:C 0.062. Growth [redacted]w[redacted]d WNL. Patient had 1 mildly elevated BP 3 days ago at 150/89. Has not checked her BP since, denies all PreE symptoms. In hospital had 1 mildly elevated BP at 157/85, repeats WNL at 116/60. - Monitor vitals - Continue ASA and labetalol - Obtain PIH admission labs as well as OB admission labs  #COVID-19 Positive (04/05/2019) - Airborne and contact precautions in negative pressure room - Asymptomatic  #Cholelithiasis Diagnosed in  MAU in 11/2018. Referred to general surgery. On prilosec and low fat diet. Stable.  #Renal pelvis enlarged on [place] Growth [place] L 49mm on 02/25/19, repeat on 03/30/19 at 5.42mm is WNL and resolved.  #Rh negative Rhogam given on 11/16/18 at [redacted]w[redacted]d d/t spotting, repeat after 30 weeks on 02/25/19.  - Rh eval after delivery  #Labor: Patient to eat then cervical exam. Patient states that she was 4cm on Friday 04/02/19, if >4cm, will plan to use pitocin and cytotec if thick, if <4cm, plan to place foley bulb. #Pain: Per patient request #FWB: Cat 1; EFW: 3700g #ID: GBS negative, no prophylaxis indicated #MOF: breast #MOC: vasectomy #Circ:  Yes, IP  Shirlean Mylar, MD [facility] Family Medicine, PGY-1 04/06/2019, 10:22 AM    GME ATTESTATION:  I saw and evaluated the patient. I agree with the findings and the plan of care as documented in the resident's note.  Marlowe Alt, DO OB Fellow, Faculty [facility], Center for [facility] 04/06/2019 1:15 PM

## 2019-04-06 NOTE — Progress Notes (Signed)
LABOR PROGRESS NOTE  Brexley Cutshaw is a 39 y.o. [id number] at [redacted]w[redacted]d  admitted for IOL for cHTN on meds.  Subjective: Epidural working well  Objective: BP 127/66   Pulse 92   Temp 98.3 F (36.8 C) (Oral)   Resp 16   Ht 5\' 10"  (1.778 m)   Wt 124.7 kg   LMP 07/07/2018   SpO2 99%   BMI 39.46 kg/m  or  Vitals:   04/06/19 2003 04/06/19 2005 04/06/19 2008 04/06/19 2034  BP: 120/71 127/66    Pulse: (!) 111 92    Resp: 16 16    Temp:      TempSrc:      SpO2: 100% 100% 100% 99%  Weight:      Height:         Dilation: 9 Effacement (%): 90 Cervical Position: Anterior Station: 0 Presentation: Vertex Exam by:: [email or web address], MD FHT: baseline rate 155, moderate varibility, -acel, some decel but poor toco unable to correlate Toco: q2-3 min  Labs: Lab Results  Component Value Date   WBC 6.3 04/06/2019   HGB 9.9 (L) 04/06/2019   HCT 32.7 (L) 04/06/2019   MCV 84.7 04/06/2019   PLT 194 04/06/2019    Patient Active Problem List   Diagnosis Date Noted  . COVID-19 04/06/2019  . Chronic hypertension during pregnancy 04/06/2019  . Renal pelvis enlarged on ultrasound 02/25/2019  . Cholelithiasis 12/13/2018  . Supervision of high risk pregnancy, antepartum 11/16/2018  . Chronic hypertension affecting pregnancy 11/16/2018  . Rh negative state in antepartum period 11/16/2018    Assessment / Plan: 39 y.o. 31 at [redacted]w[redacted]d here for IOL for cHTN.  Labor: progressing well s/p AROM, now 9cm and baby feels ROT, needs to turn. Difficulty tracing contractions due to habitus and some question of lates on tracing, pit reduced from 8>6, giving 1L bolus, and repositioning with peanut. May reduce pit further in next half hour pending the outcome of these interventions. Reports prior uncomplicated NSVD's of two 5lb infants, on my leopolds measures around 7lbs though [redacted]w[redacted]d extrapolates closer to 8-9; pelvis feels adequate.  Fetal Wellbeing: Prior to epidural had good period of Cat I tracing with accels,  just prior developed some lates. Cat II at present, intermittent periods of minimal variability mixed with moderate since epidural placement. May be related to blood pressure as running significantly lower BP's compared to those on arrival since epidural placement, pending effect of reducing pit and a fluid bolus will consider also giving dose of phenylephrine to see if this improves tracing.  Pain Control:  Epidural, working well GBS: neg Anticipated MOD:  SVD  cHTN: on labetalol 100mg  BID, hold at present given variable BP's. Baseline labs normal.   COVID+: mild symptoms, on precautions  Rh neg: will need RH eval after dleivery  [place], MD/[facility] OB Fellow  04/06/2019, 8:56 PM

## 2019-04-06 NOTE — Anesthesia Procedure Notes (Deleted)
Epidural

## 2019-04-06 NOTE — Anesthesia Preprocedure Evaluation (Signed)
Anesthesia Evaluation  Patient identified by MRN, date of birth, ID band Patient awake    Reviewed: Allergy & Precautions, NPO status , Patient's Chart, lab work & pertinent test results  Airway Mallampati: III  TM Distance: >3 FB Neck ROM: Full    Dental no notable dental hx.    Pulmonary neg pulmonary ROS,  COVID positive with congestion and fatigue, normal sats on RA   Pulmonary exam normal breath sounds clear to auscultation       Cardiovascular hypertension, Pt. on medications negative cardio ROS Normal cardiovascular exam Rhythm:Regular Rate:Normal     Neuro/Psych negative neurological ROS  negative psych ROS   GI/Hepatic negative GI ROS, Neg liver ROS,   Endo/Other  Morbid obesity  Renal/GU negative Renal ROS  negative genitourinary   Musculoskeletal negative musculoskeletal ROS (+)   Abdominal   Peds  Hematology  (+) Blood dyscrasia (Hgb 9.9), anemia ,   Anesthesia Other Findings IOL for cHTN  Reproductive/Obstetrics (+) Pregnancy                             Anesthesia Physical Anesthesia Plan  ASA: III  Anesthesia Plan: Epidural   Post-op Pain Management:    Induction:   PONV Risk Score and Plan: Treatment may vary due to age or medical condition  Airway Management Planned: Natural Airway  Additional Equipment:   Intra-op Plan:   Post-operative Plan:   Informed Consent: I have reviewed the patients History and Physical, chart, labs and discussed the procedure including the risks, benefits and alternatives for the proposed anesthesia with the patient or authorized representative who has indicated his/her understanding and acceptance.       Plan Discussed with: Anesthesiologist  Anesthesia Plan Comments: (Patient identified. Risks, benefits, options discussed with patient including but not limited to bleeding, infection, nerve damage, paralysis, failed block,  incomplete pain control, headache, blood pressure changes, nausea, vomiting, reactions to medication, itching, and post partum back pain. Confirmed with bedside nurse the patient's most recent platelet count. Confirmed with the patient that they are not taking any anticoagulation, have any bleeding history or any family history of bleeding disorders. Patient expressed understanding and wishes to proceed. All questions were answered. )        Anesthesia Quick Evaluation

## 2019-04-06 NOTE — Progress Notes (Signed)
Labor Progress Note Tammy Mcmahon is a 39 y.o. [id number] at [redacted]w[redacted]d presented for IOL for cHTN. S: Becoming more uncomfortable with ctx, 6/10 pain. Requesting epidural. Sinus pain, will dose tylenol  O:  BP 132/63 (BP Location: Right Arm)   Pulse 83   Temp 98.1 F (36.7 C) (Oral)   Resp 16   Ht 5\' 10"  (1.778 m)   Wt 124.7 kg   LMP 07/07/2018   BMI 39.46 kg/m  EFM: 140 bpm/+accels/-decels  CVE: Dilation: 5.5 Effacement (%): 80 Station: -2 Presentation: Vertex Exam by:: MD Yumiko Alkins   [name]: 39 y.o. 31 [redacted]w[redacted]d here for IOL d/t cHTN.   #Labor: AROM for clear with scant fluid. Continue pitocin (6 milliunits). Patient to request epidural as she is becoming more uncomfortable.  #cHTN Patient well controlled on ASA 162 mg daily, labetalol 100 mg BID. Baseline PIH labs WNL. In hospital had 2 mildly elevated BPs at 140s-150s/80s. Asymptomatic. - Monitor vitals - Continue ASA and labetalol - PIH admission labs as well as OB admission labs pending  #COVID-19 Positive Tested positive 04/05/2019, sinus pressure is only symptom - Airborne and contact precautions in negative pressure room  #Cholelithiasis Diagnosed in MAU in 11/2018. Referred to general surgery. On prilosec and low fat diet. Stable.  #Renal pelvis enlarged on 12/2018 Growth [place] L 63mm on 02/25/19, repeat on 03/30/19 at 5.54mm is WNL and resolved.  #Rh negative Rhogam given on 11/16/18 at [redacted]w[redacted]d d/t spotting, repeat after 30 weeks on 02/25/19.  - Rh eval after delivery  #Pain: Per patient request, wants epidural #FWB: Cat 1 #GBS negative   14/3/20, MD 5:19 PM

## 2019-04-06 NOTE — Progress Notes (Signed)
Labor Progress Note Tammy Mcmahon is a 39 y.o. [id number] at [redacted]w[redacted]d presented for IOL for cHTN. S: Performing CVE to start IOL.  O:  BP (!) 149/77 (BP Location: Right Arm)   Pulse 93   Temp 98.2 F (36.8 C) (Oral)   Resp 16   Ht 5\' 10"  (1.778 m)   Wt 124.7 kg   LMP 07/07/2018   BMI 39.46 kg/m  EFM: 140 bpm/+accels/-decels  CVE: Dilation: 4 Effacement (%): 60 Station: -2 Presentation: Vertex Exam by:: MD Tammy Mcmahon   [name]: 40 y.o. 31 [redacted]w[redacted]d here for IOL d/t cHTN.   #Labor: Membranes swept, no FB necessary. Bishop score of 8, cervix favorable, plan to start low-dose pitocin.  #cHTN Patient well controlled on ASA 162 mg daily, labetalol 100 mg BID. Baseline PIH labs WNL. In hospital had 2 mildly elevated BPs at 140s-150s/80s. Asymptomatic. - Monitor vitals - Continue ASA and labetalol - PIH admission labs as well as OB admission labs pending  #COVID-19 Positive Tested positive 04/05/2019, sinus pressure is only symptom - Airborne and contact precautions in negative pressure room  #Cholelithiasis Diagnosed in MAU in 11/2018. Referred to general surgery. On prilosec and low fat diet. Stable.  #Renal pelvis enlarged on 12/2018 Growth [place] L 23mm on 02/25/19, repeat on 03/30/19 at 5.79mm is WNL and resolved.  #Rh negative Rhogam given on 11/16/18 at [redacted]w[redacted]d d/t spotting, repeat after 30 weeks on 02/25/19.  - Rh eval after delivery  #Pain: Per patient request, wants epidural #FWB: Cat 1 #GBS negative   14/3/20, MD 1:00 PM

## 2019-04-06 NOTE — Discharge Summary (Addendum)
Postpartum Discharge Summary  Date of Service updated     Patient Name: Tammy Mcmahon DOB: Mar 27, 1980 MRN: [id number]  Date of admission: 04/06/2019 Delivering Provider: Clarnce Flock   Date of discharge: 04/08/2019  Admitting diagnosis: Chronic hypertension during pregnancy [O10.919] Intrauterine pregnancy: [redacted]w[redacted]d    Secondary diagnosis:  Active Problems:   Supervision of high risk pregnancy, antepartum   Chronic hypertension affecting pregnancy   Rh negative state in antepartum period   Chronic hypertension during pregnancy  Additional problems:  Diagnosed with COVID on IOL.      Discharge diagnosis: Term Pregnancy Delivered and CHTN                                                                                                Post partum procedures:rhogam  Augmentation: AROM and Pitocin  Complications: None  Hospital course:  Induction of Labor With Vaginal Delivery   39y.o. yo [id number] admitted to the hospital 04/06/2019 for induction of labor.  Indication for induction: cHTN on meds.  Patient had an uncomplicated labor course as follows: arrived at 4cm, induced with pitocin and AROM.  Membrane Rupture Time/Date: 5:07 PM ,04/06/2019   Intrapartum Procedures: Episiotomy: None [1]                                         Lacerations:  1st degree [2];Periurethral [8];Labial [10];Perineal [11]  Patient had delivery of a Viable infant.  Information for the patient's newborn:  PoFederica, [place]0[[id number]]Delivery Method: Vag-Spont    04/06/2019  Details of delivery can be found in separate delivery note.  Patient had a routine postpartum course. Patient is discharged home 04/08/19. Delivery time: 10:34 PM    Magnesium Sulfate received: No BMZ received: No Rhophylac:Yes MMR:N/A Transfusion:No  Physical exam  Vitals:   04/07/19 1310 04/07/19 2058 04/08/19 0621 04/08/19 0916  BP: 128/82 (!) 147/85 (!) 149/81 (!) 144/82  Pulse:  (!) 105 99   Resp:  _0 Temp: 98.1 F (36.7 C) 98.2 F (36.8 C) 98.1 F (36.7 C)   TempSrc: Oral Oral Oral   SpO2: 100% 100% 100%   Weight:      Height:       General: alert, cooperative and no distress Lochia: appropriate Uterine Fundus: firm Incision: N/A DVT Evaluation: No evidence of DVT seen on physical exam. Labs: Lab Results  Component Value Date   WBC 7.8 04/07/2019   HGB 8.6 (L) 04/07/2019   HCT 27.8 (L) 04/07/2019   MCV 83.5 04/07/2019   PLT 151 04/07/2019   CMP Latest Ref Rng & Units 04/06/2019  Glucose 70 - 99 mg/dL 84  BUN 6 - 20 mg/dL 6  Creatinine 0.44 - 1.00 mg/dL 0.59  Sodium 135 - 145 mmol/L 136  Potassium 3.5 - 5.1 mmol/L 3.5  Chloride 98 - 111 mmol/L 108  CO2 22 - 32 mmol/L 20(L)  Calcium 8.9 - 10.3 mg/dL 8.3(L)  Total Protein 6.5 -  8.1 g/dL 6.3(L)  Total Bilirubin 0.3 - 1.2 mg/dL 0.8  Alkaline Phos 38 - 126 U/L 119  AST 15 - 41 U/L 14(L)  ALT 0 - 44 U/L 6    Discharge instruction: per After Visit Summary and "Baby and Me Booklet".  After visit meds:  Allergies as of 04/08/2019      Reactions   Fish Oil Itching, Rash      Medication List    STOP taking these medications   aspirin EC 81 MG tablet   ferrous sulfate 325 (65 FE) MG tablet   labetalol 100 MG tablet Commonly known as: NORMODYNE   ondansetron 8 MG tablet Commonly known as: ZOFRAN   promethazine 25 MG tablet Commonly known as: PHENERGAN     TAKE these medications   acetaminophen 500 MG tablet Commonly known as: TYLENOL Take 1,000 mg by mouth every 6 (six) hours as needed for mild pain.   amLODipine 10 MG tablet Commonly known as: NORVASC Take 1 tablet (10 mg total) by mouth daily. Start taking on: April 09, 2019   fluticasone 50 MCG/ACT nasal spray Commonly known as: FLONASE Place 1 spray into both nostrils daily.   ibuprofen 600 MG tablet Commonly known as: ADVIL Take 1 tablet (600 mg total) by mouth every 6 (six) hours.   omeprazole 20 MG capsule Commonly known as:  PriLOSEC Take 1 capsule (20 mg total) by mouth 2 (two) times daily before a meal.   witch hazel-glycerin pad Commonly known as: TUCKS Apply 1 application topically as needed for hemorrhoids.       Diet: routine diet  Activity: Advance as tolerated. Pelvic rest for 6 weeks.   Outpatient follow up:6 weeks Follow up Appt: Future Appointments  Date Time Provider Department Center  04/13/2019 10:10 AM [facility] NURSE [facility]  05/13/2019  1:30 PM Cresenzo-Dishmon, Frances, CNM [facility]   Follow up Visit:  Please schedule this patient for Postpartum visit in: 6 weeks with the following provider: Any provider For C/S patients schedule nurse incision check in weeks 2 weeks: no High risk pregnancy complicated by: cHTN on meds Delivery mode:  SVD Anticipated Birth Control:  pills & vasectomy PP Procedures needed: BP check  Schedule Integrated BH visit: no  Patient has BP cuff and has virtual BP check on 1/19 with [facility] nurse.  -Given that patient has elevated BMI,  Is now PP, and has chronic hypertension, will defer RX for COCPs for now and patient can discuss at PP visit.   Newborn Data: Live born female  Birth Weight:  7 lbs 7 oz APGAR: 9, 9  Newborn Delivery   Birth date/time: 04/06/2019 22:34:00 Delivery type: Vaginal, Spontaneous      Baby Feeding: Breast Disposition:home with mother   04/08/2019 Kathryn Lorraine Kooistra, CNM   

## 2019-04-07 ENCOUNTER — Encounter (HOSPITAL_COMMUNITY): Payer: Self-pay | Admitting: Obstetrics & Gynecology

## 2019-04-07 LAB — CBC WITH DIFFERENTIAL/PLATELET
Abs Immature Granulocytes: 0.05 10*3/uL (ref 0.00–0.07)
Basophils Absolute: 0 10*3/uL (ref 0.0–0.1)
Basophils Relative: 0 %
Eosinophils Absolute: 0 10*3/uL (ref 0.0–0.5)
Eosinophils Relative: 0 %
HCT: 27.8 % — ABNORMAL LOW (ref 36.0–46.0)
Hemoglobin: 8.6 g/dL — ABNORMAL LOW (ref 12.0–15.0)
Immature Granulocytes: 1 %
Lymphocytes Relative: 11 %
Lymphs Abs: 0.8 10*3/uL (ref 0.7–4.0)
MCH: 25.8 pg — ABNORMAL LOW (ref 26.0–34.0)
MCHC: 30.9 g/dL (ref 30.0–36.0)
MCV: 83.5 fL (ref 80.0–100.0)
Monocytes Absolute: 0.5 10*3/uL (ref 0.1–1.0)
Monocytes Relative: 7 %
Neutro Abs: 6.4 10*3/uL (ref 1.7–7.7)
Neutrophils Relative %: 81 %
Platelets: 151 10*3/uL (ref 150–400)
RBC: 3.33 MIL/uL — ABNORMAL LOW (ref 3.87–5.11)
RDW: 13.6 % (ref 11.5–15.5)
WBC: 7.8 10*3/uL (ref 4.0–10.5)
nRBC: 0 % (ref 0.0–0.2)

## 2019-04-07 LAB — RPR: RPR Ser Ql: NONREACTIVE

## 2019-04-07 MED ORDER — BENZOCAINE-MENTHOL 20-0.5 % EX AERO
1.0000 "application " | INHALATION_SPRAY | CUTANEOUS | Status: DC | PRN
  Administered 2019-04-07: 1 via TOPICAL
  Filled 2019-04-07: qty 56

## 2019-04-07 MED ORDER — SIMETHICONE 80 MG PO CHEW: 80.0000 mg | CHEWABLE_TABLET | ORAL | Status: DC | PRN

## 2019-04-07 MED ORDER — LABETALOL HCL 100 MG PO TABS
100.0000 mg | ORAL_TABLET | Freq: Two times a day (BID) | ORAL | Status: DC
  Administered 2019-04-07 (×3): 100 mg via ORAL
  Filled 2019-04-07 (×3): qty 1

## 2019-04-07 MED ORDER — SENNOSIDES-DOCUSATE SODIUM 8.6-50 MG PO TABS
2.0000 | ORAL_TABLET | ORAL | Status: DC
  Administered 2019-04-07 (×2): 2 via ORAL
  Filled 2019-04-07 (×2): qty 2

## 2019-04-07 MED ORDER — FERROUS SULFATE 325 (65 FE) MG PO TABS
325.0000 mg | ORAL_TABLET | ORAL | Status: DC
  Administered 2019-04-07: 325 mg via ORAL
  Filled 2019-04-07: qty 1

## 2019-04-07 MED ORDER — MAGNESIUM HYDROXIDE 400 MG/5ML PO SUSP: 30.0000 mL | ORAL | Status: DC | PRN

## 2019-04-07 MED ORDER — OXYCODONE HCL 5 MG PO TABS: 10.0000 mg | ORAL_TABLET | ORAL | Status: DC | PRN

## 2019-04-07 MED ORDER — IBUPROFEN 600 MG PO TABS
600.0000 mg | ORAL_TABLET | Freq: Four times a day (QID) | ORAL | Status: DC
  Administered 2019-04-07 – 2019-04-08 (×7): 600 mg via ORAL
  Filled 2019-04-07 (×6): qty 1

## 2019-04-07 MED ORDER — DIBUCAINE (PERIANAL) 1 % EX OINT: 1.0000 "application " | TOPICAL_OINTMENT | CUTANEOUS | Status: DC | PRN

## 2019-04-07 MED ORDER — ONDANSETRON HCL 4 MG/2ML IJ SOLN: 4.0000 mg | INTRAMUSCULAR | Status: DC | PRN

## 2019-04-07 MED ORDER — DIPHENHYDRAMINE HCL 25 MG PO CAPS: 25.0000 mg | ORAL_CAPSULE | Freq: Four times a day (QID) | ORAL | Status: DC | PRN

## 2019-04-07 MED ORDER — WITCH HAZEL-GLYCERIN EX PADS: 1.0000 "application " | MEDICATED_PAD | CUTANEOUS | Status: DC | PRN

## 2019-04-07 MED ORDER — OXYCODONE HCL 5 MG PO TABS: 5.0000 mg | ORAL_TABLET | ORAL | Status: DC | PRN

## 2019-04-07 MED ORDER — RHO D IMMUNE GLOBULIN 1500 UNIT/2ML IJ SOSY
300.0000 ug | PREFILLED_SYRINGE | Freq: Once | INTRAMUSCULAR | Status: AC
  Administered 2019-04-07: 300 ug via INTRAVENOUS
  Filled 2019-04-07: qty 2

## 2019-04-07 MED ORDER — ACETAMINOPHEN 325 MG PO TABS
650.0000 mg | ORAL_TABLET | ORAL | Status: DC | PRN
  Administered 2019-04-07 – 2019-04-08 (×2): 650 mg via ORAL
  Filled 2019-04-07 (×2): qty 2

## 2019-04-07 MED ORDER — PRENATAL MULTIVITAMIN CH
1.0000 | ORAL_TABLET | Freq: Every day | ORAL | Status: DC
  Administered 2019-04-07 – 2019-04-08 (×2): 1 via ORAL
  Filled 2019-04-07 (×2): qty 1

## 2019-04-07 MED ORDER — COCONUT OIL OIL: 1.0000 "application " | TOPICAL_OIL | Status: DC | PRN

## 2019-04-07 MED ORDER — ONDANSETRON HCL 4 MG PO TABS: 4.0000 mg | ORAL_TABLET | ORAL | Status: DC | PRN

## 2019-04-07 NOTE — Progress Notes (Signed)
POSTPARTUM PROGRESS NOTE  Post Partum Day 1  Subjective:  Tammy Mcmahon is a 39 y.o. [id number] s/p NSVD at [redacted]w[redacted]d.  She reports she is doing well. No acute events overnight. She denies any problems with ambulating, voiding or po intake. Denies nausea or vomiting.  Pain is well controlled.  Lochia is appropriate.  Objective: Blood pressure 138/86, pulse 88, temperature 98.6 F (37 C), temperature source Oral, resp. rate 15, height 5\' 10"  (1.778 m), weight 124.7 kg, last menstrual period 07/07/2018, SpO2 100 %, unknown if currently breastfeeding.  Physical Exam:  General: alert, cooperative and no distress Chest: no respiratory distress Heart:regular rate, distal pulses intact Abdomen: soft, nontender,  Uterine Fundus: firm, appropriately tender DVT Evaluation: No calf swelling or tenderness Extremities: no LE edema Skin: warm, dry  Recent Labs    04/06/19 1001  HGB 9.9*  HCT 32.7*    Assessment/Plan: Tammy Mcmahon is a 39 y.o. [phone] s/p NSVD at [redacted]w[redacted]d   PPD#1 - Doing well  Routine postpartum care Anemia: start PO ferrous sulfate Contraception: partner to get vasectomy, she would like interval birth control pills Feeding: breast Rh neg: rhogam ordered, infant is Rh+ COVID: mild symptoms, on precautions Dispo: Plan for discharge PPD#2.   LOS: 1 day   [redacted]w[redacted]d, MD/[facility] OB Fellow  04/07/2019, 7:52 AM

## 2019-04-07 NOTE — Lactation Note (Signed)
This note was copied from a baby's chart. Lactation Consultation Note  Patient Name: Boy Nitzia Perren OEVOJ'J Date: 04/07/2019 Reason for consult: Follow-up assessment;1st time breastfeeding;Term  Baby is 17 hours old  ( mom + Cov - proper isolation followed by [facility].  As LC entered the room baby latched with depth and increased swallows .  Baby fed for 15 mins. Baby awake and still hungry LC offered to assist to latch  On the left breast / football position with wide open mouth and latched with depth/ swallows noted and increased with breast compressions. Baby still feeding at 10 mins. Per mom comfortable. LC stressed the importance of STS until the baby is back to birth, gaining steadily and can stay awake for majority of feeding.  Discussive vs non - nutritive feeding patterns and watching out latched.  Per mom has  A DEBP - [place].  LC asked the MBURN to take in a hand pump and instruct her.       Maternal Data Has patient been taught Hand Expression?: Yes  Feeding Feeding Type: Breast Fed  LATCH Score Latch: Grasps breast easily, tongue down, lips flanged, rhythmical sucking.  Audible Swallowing: Spontaneous and intermittent  Type of Nipple: Everted at rest and after stimulation  Comfort (Breast/Nipple): Soft / non-tender  Hold (Positioning): Assistance needed to correctly position infant at breast and maintain latch.  LATCH Score: 9  Interventions Interventions: Breast feeding basics reviewed;Assisted with latch;Skin to skin;Breast compression;Adjust position;Support pillows;Position options  Lactation Tools Discussed/Used WIC Program: No Pump Review: Milk Storage Initiated by:: MAI Date initiated:: 04/07/19   Consult Status Consult Status: Follow-up Date: 04/08/19 Follow-up type: In-patient    Matilde Sprang Tavaras Goody 04/07/2019, 3:40 PM

## 2019-04-07 NOTE — Lactation Note (Signed)
This note was copied from a baby's chart. Lactation Consultation Note Baby 8 hrs old. LC went into rm. To see mom trying to cluster care and visits. Mom stated she just went back to sleep. Can LC come back later. BF going well. Denies painful latches. Left Lactation brochure at bedside. Reported to on coming LC.  Patient Name: Tammy Mcmahon FYTWK'M Date: 04/07/2019     Maternal Data    Feeding Feeding Type: Breast Fed  LATCH Score                   Interventions    Lactation Tools Discussed/Used     Consult Status      [name] 04/07/2019, 7:00 AM

## 2019-04-07 NOTE — Anesthesia Postprocedure Evaluation (Signed)
Anesthesia Post Note  Patient: Tammy Mcmahon  Procedure(s) Performed: AN AD HOC LABOR EPIDURAL     Patient location during evaluation: Mother Baby Anesthesia Type: Epidural Level of consciousness: awake, awake and alert and oriented Pain management: pain level controlled Vital Signs Assessment: post-procedure vital signs reviewed and stable Respiratory status: spontaneous breathing and respiratory function stable Cardiovascular status: blood pressure returned to baseline Postop Assessment: no headache, epidural receding, patient able to bend at knees, adequate PO intake, no backache, no apparent nausea or vomiting and able to ambulate Anesthetic complications: no    Last Vitals:  Vitals:   04/07/19 0215 04/07/19 0540  BP: 138/78 138/86  Pulse: 94 88  Resp: 16 15  Temp: (!) 36.4 C 37 C  SpO2: 100% 100%    Last Pain:  Vitals:   04/07/19 0708  TempSrc:   PainSc: 0-No pain   Pain Goal:                   Cleda Clarks

## 2019-04-08 LAB — RH IG WORKUP (INCLUDES ABO/RH)
ABO/RH(D): AB NEG
Fetal Screen: NEGATIVE
Gestational Age(Wks): 39
Unit division: 0

## 2019-04-08 MED ORDER — AMLODIPINE BESYLATE 5 MG PO TABS
10.0000 mg | ORAL_TABLET | Freq: Every day | ORAL | Status: DC
  Administered 2019-04-08: 10 mg via ORAL
  Filled 2019-04-08: qty 2

## 2019-04-08 MED ORDER — AMLODIPINE BESYLATE 5 MG PO TABS: 10.0000 mg | ORAL_TABLET | Freq: Every day | ORAL | Status: DC

## 2019-04-08 MED ORDER — IBUPROFEN 600 MG PO TABS: 600.0000 mg | ORAL_TABLET | Freq: Four times a day (QID) | ORAL | 0 refills | Status: AC

## 2019-04-08 MED ORDER — AMLODIPINE BESYLATE 10 MG PO TABS: 10.0000 mg | ORAL_TABLET | Freq: Every day | ORAL | 1 refills | Status: DC

## 2019-04-08 MED ORDER — WITCH HAZEL-GLYCERIN EX PADS: 1.0000 "application " | MEDICATED_PAD | CUTANEOUS | 12 refills | Status: AC | PRN

## 2019-04-08 MED FILL — AMLODIPINE BESYLATE 10 MG T: 10 | 30 days supply | Qty: 30 | Fill #0

## 2019-04-08 MED FILL — IBUPROFEN 600 MG TABLET: 600 | 7 days supply | Qty: 30 | Fill #0

## 2019-04-08 MED FILL — A.E.R. WITCH HAZEL PADS: 30 days supply | Qty: 40 | Fill #0

## 2019-04-08 NOTE — Lactation Note (Signed)
This note was copied from a baby's chart. Lactation Consultation Note  Patient Name: Tammy Mcmahon FXJOI'T Date: 04/08/2019    Infant is 79 hrs old. Infant is 6% below BW, but also noted to have 4 voids & 7 stools since birth. Mom is a P3. I called into room @ [phone]. Dad answered the phone; Mom was sleeping. I asked if he knew if his wife would like me to drop by today for a visit. He said he did not know. I asked that once Mom wakes up she can use the call bell to let the secretary know if she would like for me to come by.   Lurline Hare [facility] 04/08/2019, 8:26 AM

## 2019-04-08 NOTE — Lactation Note (Signed)
This note was copied from a baby's chart. Lactation Consultation Note  Patient Name: Tammy Mcmahon LTRVU'Y Date: 04/08/2019  Infant is 38 hrs old. Mom called out that she was putting infant to the breast again (I had spoken on the telephone with Mom briefly at 1151 when I couldn't see her infant's last latch).  When I entered room, it was evident that infant was swallowing, but infant's alignment needed to be improved. I assisted Mom with doing so. Mom reports that there is only initial tenderness with latch that soon resolves. Infant soon unlatched, content. Mom's nipple was slightly pinched, but infant had also been only non-nutritively sucking for the last few minutes as he was falling asleep.   With FOB present, we talked about breast management & sanitizing pump parts after every use if she starts to pump. I answered parents' questions & then infant began cueing again. I assisted with alignment & infant latched with ease. Swallows were readily evident. Mom reports that her breasts feel a little heavier since last night.   Parents know how to reach [place] for questions after discharge.  Lurline Hare [facility] 04/08/2019, 1:01 PM

## 2019-04-09 ENCOUNTER — Other Ambulatory Visit: Payer: BC Managed Care – PPO

## 2019-04-09 LAB — TYPE AND SCREEN
ABO/RH(D): AB NEG
Antibody Screen: POSITIVE
Unit division: 0
Unit division: 0

## 2019-04-09 LAB — BPAM RBC
Blood Product Expiration Date: 202102082359
Blood Product Expiration Date: 202102082359
Unit Type and Rh: 600
Unit Type and Rh: 600

## 2019-04-11 ENCOUNTER — Encounter (INDEPENDENT_AMBULATORY_CARE_PROVIDER_SITE_OTHER): Payer: Self-pay

## 2019-04-12 ENCOUNTER — Encounter (INDEPENDENT_AMBULATORY_CARE_PROVIDER_SITE_OTHER): Payer: Self-pay

## 2019-04-13 ENCOUNTER — Telehealth (INDEPENDENT_AMBULATORY_CARE_PROVIDER_SITE_OTHER): Payer: BC Managed Care – PPO | Admitting: *Deleted

## 2019-04-13 ENCOUNTER — Other Ambulatory Visit: Payer: Self-pay

## 2019-04-13 VITALS — BP 129/84 | HR 86

## 2019-04-13 DIAGNOSIS — Z0130 Encounter for examination of blood pressure without abnormal findings: Secondary | ICD-10-CM

## 2019-04-13 NOTE — Progress Notes (Signed)
   I connected with  Tammy Mcmahon on 04/13/19 by a video enabled telemedicine application and verified that I am speaking with the correct person using two identifiers.   I discussed the limitations of evaluation and management by telemedicine. The patient expressed understanding and agreed to proceed.   NURSE VISIT- BLOOD PRESSURE CHECK  SUBJECTIVE:  Tammy Mcmahon is a 40 y.o. [phone] female here for BP check. She is postpartum, delivery date 04/06/2019    HYPERTENSION ROS:  . Postpartum:   . Severe headaches that deosn't go away with tylenol/other medicines: No  . Visual changes (seeing spots/double/blurred vision) No  . Severe pain under right breast breast or in center of upper chest No  . Severe nausea/vomiting No  . Taking medicines as instructed yes   OBJECTIVE:  BP 129/84 (BP Location: Left Arm, Patient Position: Sitting, Cuff Size: Normal)   Pulse 86   Breastfeeding Yes   Appearance oriented to person, place, and time.  ASSESSMENT: Postpartum  blood pressure check  PLAN: Discussed with Dr. Despina Hidden   Recommendations: no changes needed   Follow-up: as scheduled   Jobe Marker  04/13/2019 10:38 AM      Prenatal vitamin without fish oil

## 2019-04-15 ENCOUNTER — Encounter: Payer: Self-pay | Admitting: *Deleted

## 2019-04-15 ENCOUNTER — Encounter (INDEPENDENT_AMBULATORY_CARE_PROVIDER_SITE_OTHER): Payer: Self-pay

## 2019-04-21 ENCOUNTER — Telehealth: Payer: Self-pay | Admitting: *Deleted

## 2019-04-22 ENCOUNTER — Other Ambulatory Visit: Payer: Self-pay | Admitting: Advanced Practice Midwife

## 2019-04-22 DIAGNOSIS — Z029 Encounter for administrative examinations, unspecified: Secondary | ICD-10-CM

## 2019-04-22 MED ORDER — AMLODIPINE BESYLATE 10 MG PO TABS: 10.0000 mg | ORAL_TABLET | Freq: Every day | ORAL | 1 refills | Status: AC

## 2019-04-22 MED ORDER — PNV PRENATAL PLUS MULTIVITAMIN 27-1 MG PO TABS: 1.0000 | ORAL_TABLET | Freq: Every day | ORAL | 11 refills | Status: AC

## 2019-04-22 NOTE — Telephone Encounter (Signed)
Patient called requesting that her Norvasc refill be sent to [facility]. She is also in need of a PNV that does not contain  fish oil as she is allergic.

## 2019-05-13 ENCOUNTER — Ambulatory Visit: Payer: BC Managed Care – PPO | Admitting: Advanced Practice Midwife

## 2019-05-17 ENCOUNTER — Other Ambulatory Visit: Payer: Self-pay

## 2019-05-17 ENCOUNTER — Ambulatory Visit (INDEPENDENT_AMBULATORY_CARE_PROVIDER_SITE_OTHER): Payer: BC Managed Care – PPO | Admitting: Advanced Practice Midwife

## 2019-05-17 VITALS — BP 143/87 | HR 98 | Ht 70.0 in | Wt 254.0 lb

## 2019-05-17 DIAGNOSIS — R300 Dysuria: Secondary | ICD-10-CM

## 2019-05-17 DIAGNOSIS — Z1332 Encounter for screening for maternal depression: Secondary | ICD-10-CM | POA: Diagnosis not present

## 2019-05-17 MED ORDER — NORETHINDRONE 0.35 MG PO TABS: ORAL_TABLET | ORAL | 11 refills | Status: DC

## 2019-05-17 NOTE — Progress Notes (Signed)
Tammy Mcmahon is a 39 y.o. who presents for a postpartum visit. She is 5 weeks postpartum following a spontaneous vaginal delivery. I have fully reviewed the prenatal and intrapartum course. The delivery was at 39 gestational weeks. IOL for [facility] Anesthesia: epidural. Postpartum course has been uneventful. DC'd on Norvasc 10 mg. Last dose this am . Baby's course has been uneventful. Baby is feeding by breast. Bleeding: no bleeding. Bowel function is normal. Bladder function is normal. Patient is sexually active. Contraception method is none. Postpartum depression screening: negative.   Current Outpatient Medications:  .  amLODipine (NORVASC) 10 MG tablet, Take 1 tablet (10 mg total) by mouth daily., Disp: 30 tablet, Rfl: 1 .  acetaminophen (TYLENOL) 500 MG tablet, Take 1,000 mg by mouth every 6 (six) hours as needed for mild pain., Disp: , Rfl:  .  fluticasone (FLONASE) 50 MCG/ACT nasal spray, Place 1 spray into both nostrils daily. , Disp: , Rfl:  .  ibuprofen (ADVIL) 600 MG tablet, Take 1 tablet (600 mg total) by mouth every 6 (six) hours. (Patient not taking: Reported on 05/17/2019), Disp: 30 tablet, Rfl: 0 .  omeprazole (PRILOSEC) 20 MG capsule, Take 1 capsule (20 mg total) by mouth 2 (two) times daily before a meal. (Patient not taking: Reported on 05/17/2019), Disp: 60 capsule, Rfl: 6 .  Prenatal Vit-Fe Fumarate-FA (PNV PRENATAL PLUS MULTIVITAMIN) 27-1 MG TABS, Take 1 tablet by mouth daily. (Patient not taking: Reported on 05/17/2019), Disp: 30 tablet, Rfl: 11 .  witch hazel-glycerin (TUCKS) pad, Apply 1 application topically as needed for hemorrhoids. (Patient not taking: Reported on 04/13/2019), Disp: 40 each, Rfl: 12  Review of Systems   Constitutional: Negative for fever and chills Eyes: Negative for visual disturbances Respiratory: Negative for shortness of breath, dyspnea Cardiovascular: Negative for chest pain or palpitations  Gastrointestinal: Negative for vomiting, diarrhea and  constipation Genitourinary: Negative for urgency; had some mild intermittent dysuria over the last few days Musculoskeletal: Negative for back pain, joint pain, myalgias  Neurological: Negative for dizziness and headaches    Objective:     Vitals:   05/17/19 1611  BP: (!) 143/87  Pulse: 98   General:  alert, cooperative and no distress   Breasts:  negative  Lungs: Normal respiratory effort  Heart:  regular rate and rhythm  Abdomen: Soft, nontender   Vulva:  normal  Vagina: normal vagina  Cervix:  closed  Corpus: Well involuted     Rectal Exam: no hemorrhoids        Assessment:    normal postpartum exam.  Plan:   1. Contraception: oral progesterone-only contraceptive 2. Culture urine 3.  Has new PCP coming to town in April. Continue norvasc

## 2019-05-24 ENCOUNTER — Telehealth: Payer: Self-pay | Admitting: *Deleted

## 2019-05-24 NOTE — Telephone Encounter (Signed)
Patient left message that she is still continuing to have swelling since delivery.

## 2019-05-24 NOTE — Telephone Encounter (Signed)
Left message @ 4:50 pm. JSY °

## 2019-05-25 NOTE — Telephone Encounter (Addendum)
Pt continues to have swelling since delivery in calves down to feet. Pt delivered 04/06/19. 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 No other symptoms. + breastfeeding. Pt states swelling is just getting worse. Please advise. Thanks!! JSY

## 2019-05-25 NOTE — Telephone Encounter (Signed)
Pt was advised to schedule an in person appt. Pt voiced understanding and call was transferred to [facility] for appt. JSY

## 2019-05-27 ENCOUNTER — Ambulatory Visit: Payer: BC Managed Care – PPO | Admitting: Women's Health

## 2019-05-28 ENCOUNTER — Ambulatory Visit (INDEPENDENT_AMBULATORY_CARE_PROVIDER_SITE_OTHER): Payer: BC Managed Care – PPO | Admitting: Women's Health

## 2019-05-28 ENCOUNTER — Other Ambulatory Visit: Payer: Self-pay

## 2019-05-28 ENCOUNTER — Encounter: Payer: Self-pay | Admitting: Women's Health

## 2019-05-28 VITALS — BP 147/94 | HR 114 | Ht 70.0 in | Wt 249.0 lb

## 2019-05-28 DIAGNOSIS — R600 Localized edema: Secondary | ICD-10-CM | POA: Diagnosis not present

## 2019-05-28 DIAGNOSIS — I10 Essential (primary) hypertension: Secondary | ICD-10-CM

## 2019-05-28 MED ORDER — TRIAMTERENE-HCTZ 37.5-25 MG PO TABS: 1.0000 | ORAL_TABLET | Freq: Every day | ORAL | 3 refills | Status: AC

## 2019-05-28 NOTE — Patient Instructions (Signed)
Tips To Increase Milk Supply  Lots of water! Enough so that your urine is clear  Plenty of calories, if you're not getting enough calories, your milk supply can decrease  Breastfeed/pump often, every 2-3 hours x 20-30mins  Fenugreek 3 pills 3 times a day, this may make your urine smell like maple syrup  Mother's Milk Tea  Lactation cookies, google for the recipe  Real oatmeal  Body Armor sports drinks  

## 2019-05-28 NOTE — Progress Notes (Signed)
   GYN VISIT Patient name: Tammy Mcmahon MRN [id number]  Date of birth: 13-Oct-1980 Chief Complaint:   Follow-up (having swelling in legs)  History of Present Illness:   Tammy Mcmahon is a 39 y.o. [phone] Caucasian female almost 2 months s/p SVB, being seen today for bilateral lower leg swelling, worse since starting back at work. 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 Still breastfeeding. CHTN on norvasc 10mg  daily, hasn't taken yet this am. Was on labetalol prior to pregnancy.      No LMP recorded. The current method of family planning is oral progesterone-only contraceptive.  Last pap OBGYN [place] 09/15/18. Results were:  normal Review of Systems:   Pertinent items are noted in HPI Denies fever/chills, dizziness, headaches, visual disturbances, fatigue, shortness of breath, chest pain, abdominal pain, vomiting, abnormal vaginal discharge/itching/odor/irritation, problems with periods, bowel movements, urination, or intercourse unless otherwise stated above.  Pertinent History Reviewed:  Reviewed past medical,surgical, social, obstetrical and family history.  Reviewed problem list, medications and allergies. Physical Assessment:   Vitals:   05/28/19 1201  BP: (!) 147/94  Pulse: (!) 114  Weight: 249 lb (112.9 kg)  Height: 5\' 10"  (1.778 m)  Body mass index is 35.73 kg/m.       Physical Examination:   General appearance: alert, well appearing, and in no distress  Mental status: alert, oriented to person, place, and time  Skin: warm & dry   Cardiovascular: normal heart rate noted  Respiratory: normal respiratory effort, no distress  Abdomen: soft, non-tender   Pelvic: examination not indicated  Extremities: tr-1+ BLE edema  Chaperone: n/a    No results found for this or any previous visit (from the past 24 hour(s)).  Assessment & Plan:  1) 07/28/19 s/p SVB> breastfeeding  2) CHTN w/ BLE edema> currently on norvasc 10mg , didn't take this am. Stop norvasc. Rx maxzide, f/u  Tues afternoon for bp/wt/swelling check w/ nurse. Gave printed info on milk supply in case decreases some w/ diuretic  Meds:  Meds ordered this encounter  Medications  . triamterene-hydrochlorothiazide (MAXZIDE-25) 37.5-25 MG tablet    Sig: Take 1 tablet by mouth daily.    Dispense:  30 tablet    Refill:  3    Order Specific Question:   Supervising Provider    Answer:   , LUTHER H [[id number]]    No orders of the defined types were placed in this encounter.   Return in about 4 days (around 06/01/2019) for afternoon , nurse visit bp/weight/swelling check.  12-30[birth year]1991 CNM, [facility] 05/28/2019 2:25 PM

## 2019-10-06 DIAGNOSIS — I10 Essential (primary) hypertension: Secondary | ICD-10-CM | POA: Diagnosis not present

## 2019-10-06 DIAGNOSIS — Z131 Encounter for screening for diabetes mellitus: Secondary | ICD-10-CM | POA: Diagnosis not present

## 2019-10-06 DIAGNOSIS — L719 Rosacea, unspecified: Secondary | ICD-10-CM | POA: Diagnosis not present

## 2019-10-06 DIAGNOSIS — R109 Unspecified abdominal pain: Secondary | ICD-10-CM | POA: Diagnosis not present

## 2019-10-06 DIAGNOSIS — Z0001 Encounter for general adult medical examination with abnormal findings: Secondary | ICD-10-CM | POA: Diagnosis not present

## 2019-12-10 DIAGNOSIS — H109 Unspecified conjunctivitis: Secondary | ICD-10-CM | POA: Diagnosis not present

## 2020-01-10 IMAGING — US US ABDOMEN LIMITED
1 series · 15 of 25 positions shown · non-contrast
Comparison: None.

CLINICAL DATA: Right upper quadrant pain

EXAM:
ULTRASOUND ABDOMEN LIMITED RIGHT UPPER QUADRANT

[Series 1: us abdomen limited · 15 of 43 slices shown]
[im 1/43]
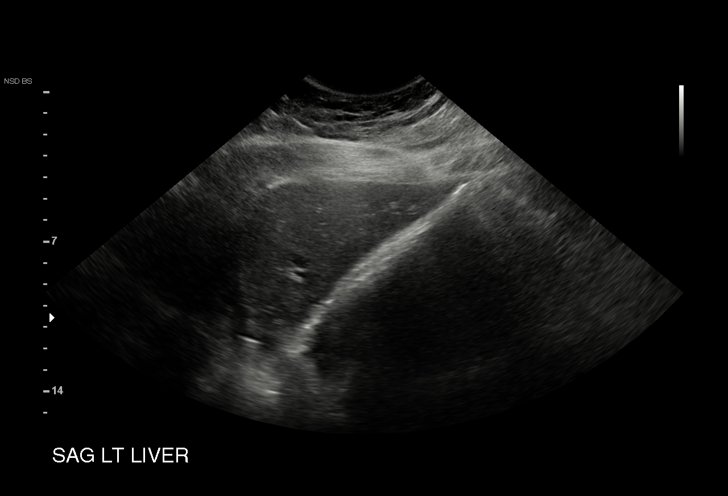
[im 4/43]
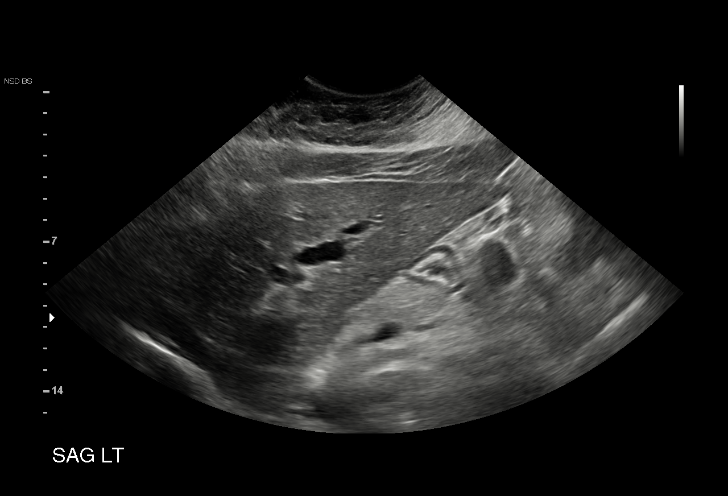
[im 8/43]
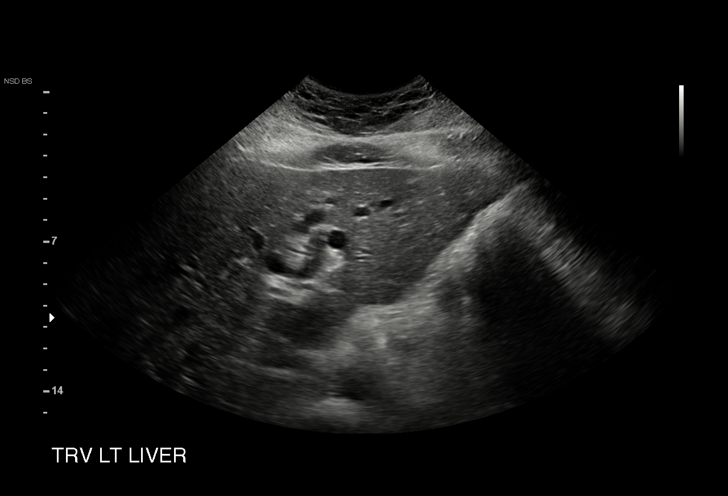
[im 9/43]
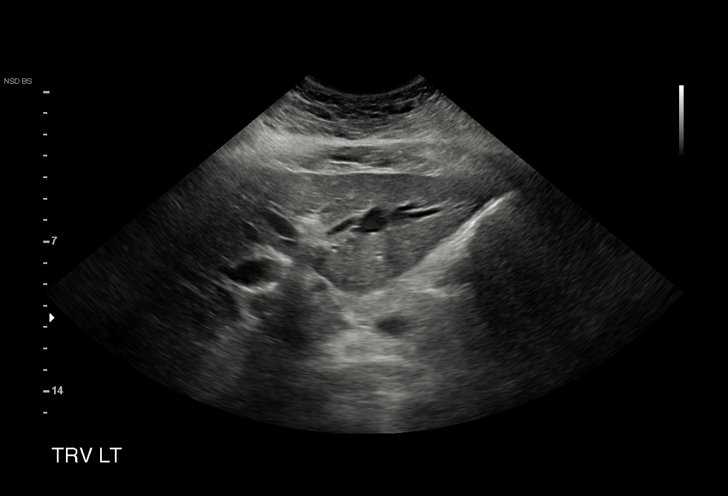
[im 13/43]
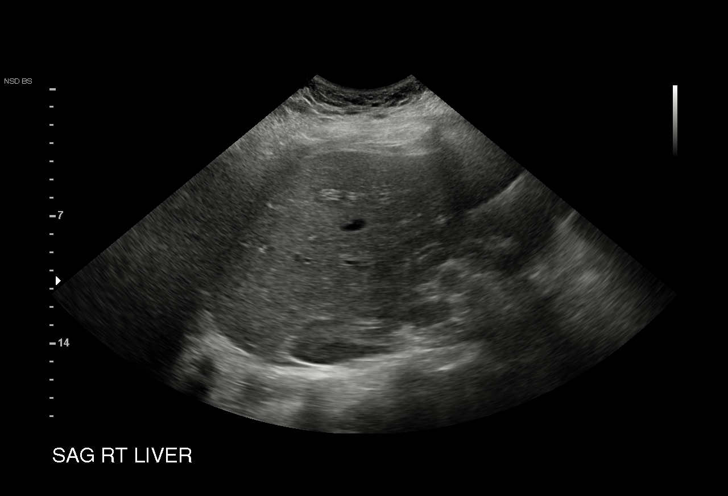
[im 16/43]
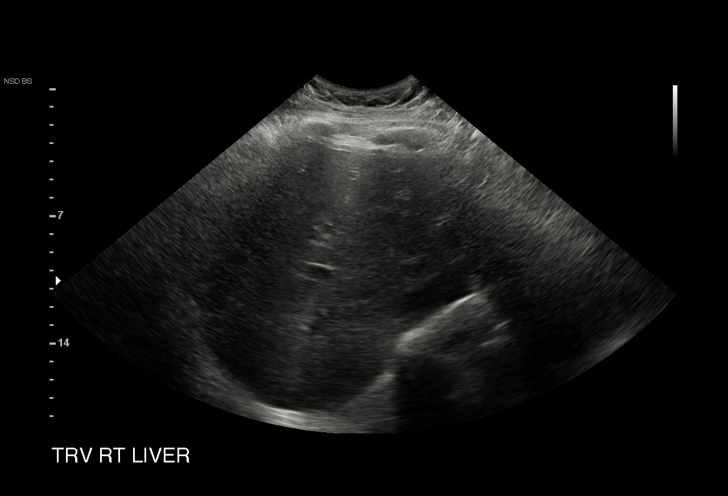
[im 18/43]
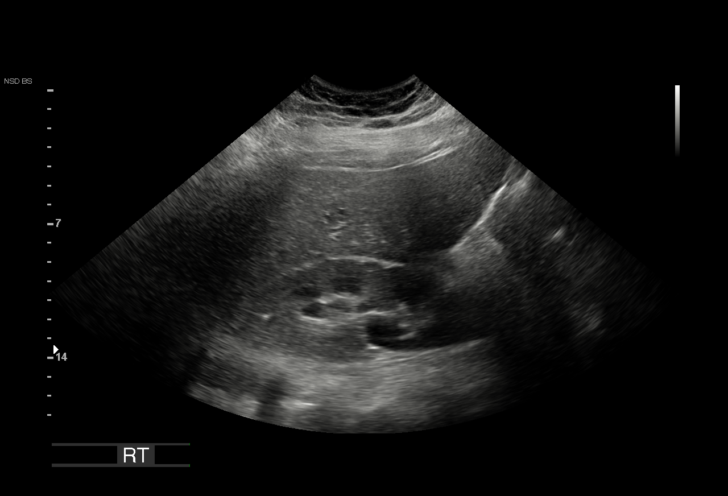
[im 22/43]
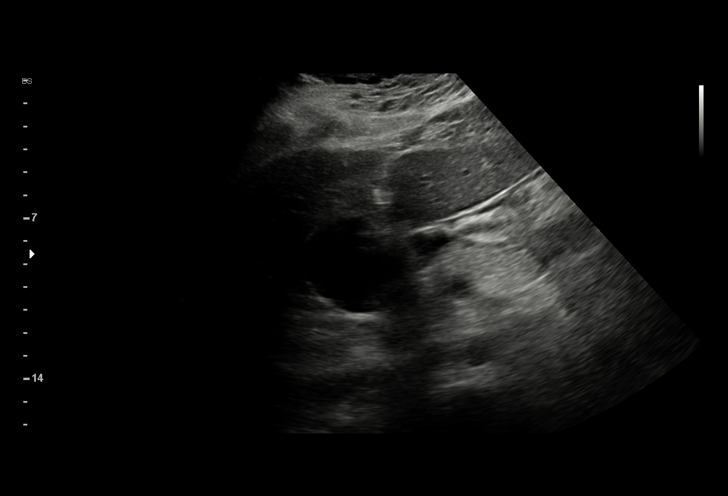
[im 25/43]
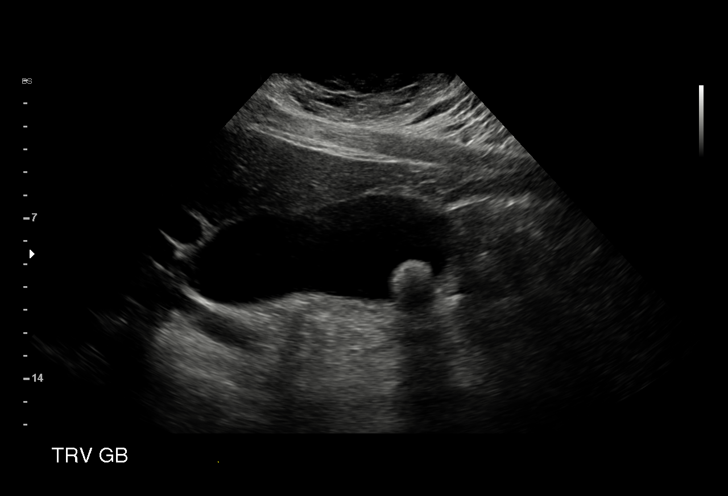
[im 27/43]
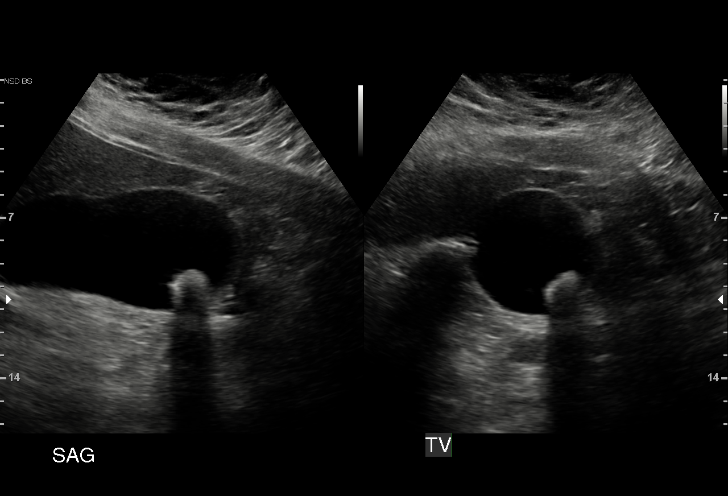
[im 30/43]
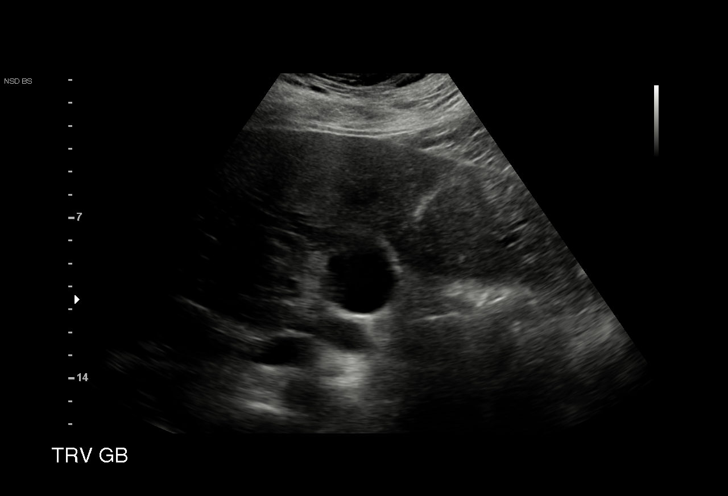
[im 34/43]
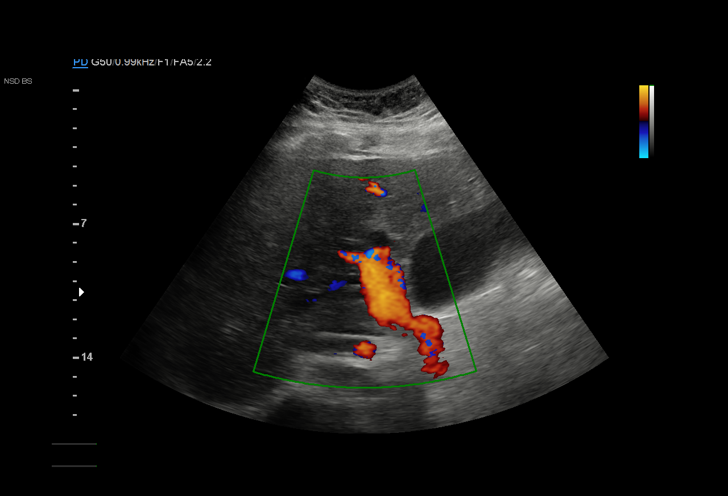
[im 36/43]
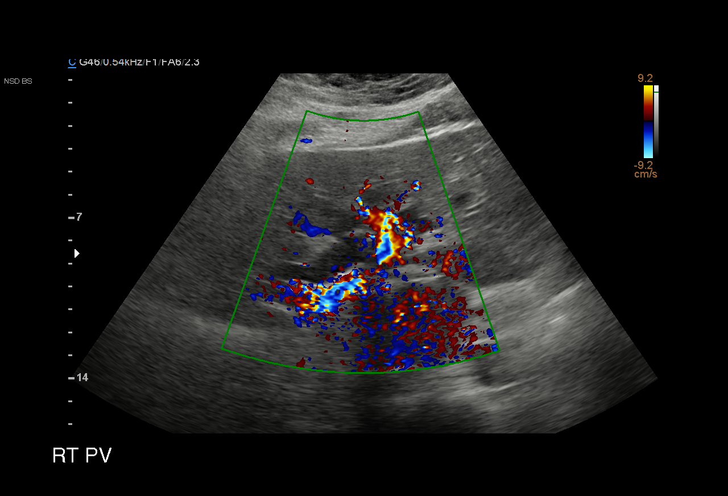
[im 39/43]
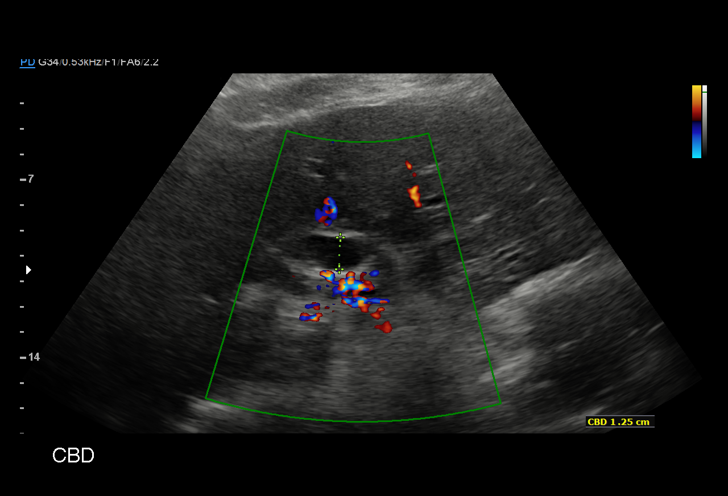
[im 43/43]
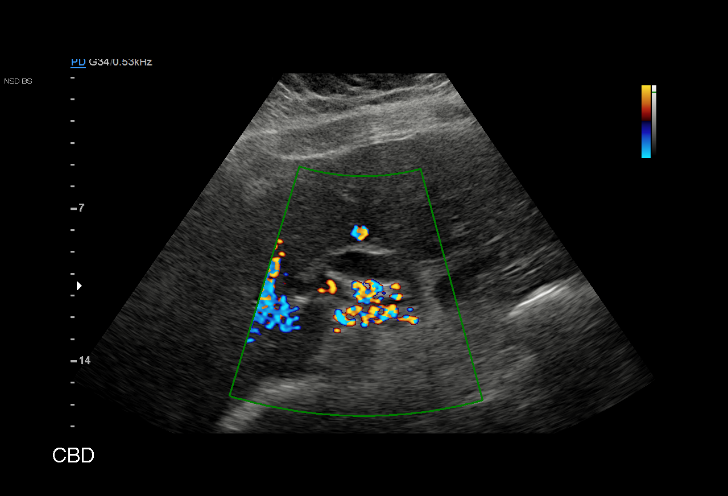

[15 of 25 positions shown; findings below may reference images not displayed]

FINDINGS: Gallbladder:

There is a mobile stone within the gallbladder that measures up to
1.8 cm. A positive sonographic Murphy sign was reported by the
sonographer. No gallbladder wall thickening or pericholecystic
fluid.

Common bile duct:

Diameter: 12 mm

Liver:

No focal lesion identified. Within normal limits in parenchymal
echogenicity. Portal vein is patent on color Doppler imaging with
normal direction of blood flow towards the liver.

Other: None.
IMPRESSION: Cholelithiasis with reported positive sonographic Murphy sign is
compatible with acute cholecystitis in the appropriate context.
Dilated common bile duct.

## 2020-04-17 ENCOUNTER — Other Ambulatory Visit: Payer: Self-pay | Admitting: Advanced Practice Midwife

## 2020-04-20 DIAGNOSIS — L719 Rosacea, unspecified: Secondary | ICD-10-CM | POA: Diagnosis not present

## 2020-04-24 DIAGNOSIS — M1712 Unilateral primary osteoarthritis, left knee: Secondary | ICD-10-CM | POA: Diagnosis not present

## 2021-03-21 ENCOUNTER — Other Ambulatory Visit: Payer: Self-pay | Admitting: Advanced Practice Midwife
